# Patient Record
Sex: Female | Born: 1990 | Race: Black or African American | Hispanic: No | Marital: Single | Smoking: Never smoker
Health system: Southern US, Community
[De-identification: ages and names within clinical notes are randomized; demographics above are authoritative.]

---

## 2011-03-22 ENCOUNTER — Emergency Department (HOSPITAL_COMMUNITY): Payer: Medicaid Other

## 2011-03-22 ENCOUNTER — Emergency Department (HOSPITAL_COMMUNITY)
Admission: EM | Admit: 2011-03-22 | Discharge: 2011-03-22 | Disposition: A | Discharge status: Home / Self Care | Payer: Medicaid Other | Attending: Emergency Medicine | Admitting: Emergency Medicine

## 2011-03-22 DIAGNOSIS — M546 Pain in thoracic spine: Secondary | ICD-10-CM | POA: Insufficient documentation

## 2011-03-22 DIAGNOSIS — M542 Cervicalgia: Secondary | ICD-10-CM | POA: Insufficient documentation

## 2011-03-22 DIAGNOSIS — S139XXA Sprain of joints and ligaments of unspecified parts of neck, initial encounter: Secondary | ICD-10-CM | POA: Insufficient documentation

## 2011-03-22 DIAGNOSIS — S9030XA Contusion of unspecified foot, initial encounter: Secondary | ICD-10-CM | POA: Insufficient documentation

## 2011-03-22 DIAGNOSIS — T148XXA Other injury of unspecified body region, initial encounter: Secondary | ICD-10-CM | POA: Insufficient documentation

## 2011-03-22 LAB — URINALYSIS, ROUTINE W REFLEX MICROSCOPIC
Glucose, UA: NEGATIVE mg/dL
Ketones, ur: NEGATIVE mg/dL
Protein, ur: NEGATIVE mg/dL
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
## 2012-10-26 ENCOUNTER — Emergency Department (HOSPITAL_BASED_OUTPATIENT_CLINIC_OR_DEPARTMENT_OTHER): Payer: Medicaid Other

## 2012-10-26 ENCOUNTER — Emergency Department (HOSPITAL_BASED_OUTPATIENT_CLINIC_OR_DEPARTMENT_OTHER)
Admission: EM | Admit: 2012-10-26 | Discharge: 2012-10-26 | Disposition: A | Discharge status: Home / Self Care | Payer: Medicaid Other | Attending: Emergency Medicine | Admitting: Emergency Medicine

## 2012-10-26 ENCOUNTER — Encounter (HOSPITAL_BASED_OUTPATIENT_CLINIC_OR_DEPARTMENT_OTHER): Payer: Self-pay | Admitting: *Deleted

## 2012-10-26 DIAGNOSIS — Z3490 Encounter for supervision of normal pregnancy, unspecified, unspecified trimester: Secondary | ICD-10-CM

## 2012-10-26 DIAGNOSIS — N760 Acute vaginitis: Secondary | ICD-10-CM | POA: Insufficient documentation

## 2012-10-26 DIAGNOSIS — O9989 Other specified diseases and conditions complicating pregnancy, childbirth and the puerperium: Secondary | ICD-10-CM | POA: Insufficient documentation

## 2012-10-26 DIAGNOSIS — B9689 Other specified bacterial agents as the cause of diseases classified elsewhere: Secondary | ICD-10-CM

## 2012-10-26 DIAGNOSIS — R197 Diarrhea, unspecified: Secondary | ICD-10-CM | POA: Insufficient documentation

## 2012-10-26 DIAGNOSIS — Z3201 Encounter for pregnancy test, result positive: Secondary | ICD-10-CM | POA: Insufficient documentation

## 2012-10-26 DIAGNOSIS — O219 Vomiting of pregnancy, unspecified: Secondary | ICD-10-CM | POA: Insufficient documentation

## 2012-10-26 DIAGNOSIS — R109 Unspecified abdominal pain: Secondary | ICD-10-CM | POA: Insufficient documentation

## 2012-10-26 LAB — WET PREP, GENITAL: Trich, Wet Prep: NONE SEEN

## 2012-10-26 LAB — URINALYSIS, ROUTINE W REFLEX MICROSCOPIC
Bilirubin Urine: NEGATIVE
Hgb urine dipstick: NEGATIVE
Specific Gravity, Urine: 1.026 (ref 1.005–1.030)
pH: 5 (ref 5.0–8.0)

## 2012-10-26 MED ORDER — PRENATAL COMPLETE 14-0.4 MG PO TABS: ORAL_TABLET | ORAL | Status: DC

## 2012-10-26 MED ORDER — ONDANSETRON HCL 4 MG PO TABS: 4.0000 mg | ORAL_TABLET | Freq: Four times a day (QID) | ORAL | Status: DC

## 2012-10-26 MED ORDER — METRONIDAZOLE 500 MG PO TABS: 500.0000 mg | ORAL_TABLET | Freq: Two times a day (BID) | ORAL | Status: DC

## 2012-10-26 NOTE — ED Provider Notes (Signed)
History     CSN: [id number]  Arrival date & time 10/26/12  1237   First MD Initiated Contact with Patient 10/26/12 1248      Chief Complaint  Patient presents with  . Abdominal Pain    (Consider location/radiation/quality/duration/timing/severity/associated sxs/prior treatment) Patient is a 22 y.o. female presenting with vomiting. The history is provided by the patient. No language interpreter was used.  Emesis  This is a new problem. The current episode started 12 to 24 hours ago. The problem occurs 2 to 4 times per day. The problem has been gradually worsening. The emesis has an appearance of stomach contents. There has been no fever. Associated symptoms include abdominal pain and diarrhea. Pertinent negatives include no chills and no fever.  Pt complains of vomiting and diarrhea.   Pt reports she is early pregnant.  Last period in November.  No prenatal care  History reviewed. No pertinent past medical history.  History reviewed. No pertinent past surgical history.  History reviewed. No pertinent family history.  History  Substance Use Topics  . Smoking status: Never Smoker   . Smokeless tobacco: Not on file  . Alcohol Use: No    OB History    Grav Para Term Preterm Abortions TAB SAB Ect Mult Living   3 1 1  1            Review of Systems  Constitutional: Negative for fever and chills.  Gastrointestinal: Positive for vomiting, abdominal pain and diarrhea.  All other systems reviewed and are negative.    Allergies  Review of patient's allergies indicates no known allergies.  Home Medications  No current outpatient prescriptions on file.  BP 120/56  Pulse 95  Temp 97.9 F (36.6 C) (Oral)  Resp 20  Wt 170 lb (77.111 kg)  SpO2 100%  LMP 09/09/2012  Physical Exam  Nursing note and vitals reviewed. Constitutional: She is oriented to person, place, and time. She appears well-developed and well-nourished.  HENT:  Head: Normocephalic and atraumatic.  Eyes:  Conjunctivae normal and EOM are normal. Pupils are equal, round, and reactive to light.  Neck: Normal range of motion. Neck supple.  Cardiovascular: Normal rate.   Pulmonary/Chest: Effort normal and breath sounds normal.  Abdominal: Soft.  Musculoskeletal: Normal range of motion.  Neurological: She is alert and oriented to person, place, and time. She has normal reflexes.  Skin: Skin is warm.  Psychiatric: She has a normal mood and affect.    ED Course  Procedures (including critical care time)  Labs Reviewed  PREGNANCY, URINE - Abnormal; Notable for the following:    Preg Test, Ur POSITIVE (*)     All other components within normal limits  URINALYSIS, ROUTINE W REFLEX MICROSCOPIC - Abnormal; Notable for the following:    APPearance CLOUDY (*)     Ketones, ur 40 (*)     All other components within normal limits   No results found.   No diagnosis found.    MDM  Ultrasound 8 week 4 day iup.  moderate clue cells Pt given rx for flagyl, prenatal vitamins and zofran   Pt advised to schedule prenatal care        Elson Areas, [place] 10/26/12 1454

## 2012-10-26 NOTE — ED Notes (Signed)
Pelvic cart is set up and at the bedside. The cart is ready for the doctor to use.

## 2012-10-26 NOTE — ED Notes (Addendum)
Patient having sharp abd pain throughout abd. States that she has not seen an ob/gyn. Denies any bleeding or discharge

## 2012-10-27 NOTE — ED Provider Notes (Signed)
Medical screening examination/treatment/procedure(s) were performed by non-physician practitioner and as supervising physician I was immediately available for consultation/collaboration.   Charlestine Rookstool B. Bernette Mayers, MD 10/27/12 [phone]

## 2012-10-28 LAB — GC/CHLAMYDIA PROBE AMP
CT Probe RNA: NEGATIVE
GC Probe RNA: NEGATIVE

## 2014-08-11 ENCOUNTER — Emergency Department (HOSPITAL_BASED_OUTPATIENT_CLINIC_OR_DEPARTMENT_OTHER): Payer: No Typology Code available for payment source

## 2014-08-11 ENCOUNTER — Emergency Department (HOSPITAL_BASED_OUTPATIENT_CLINIC_OR_DEPARTMENT_OTHER)
Admission: EM | Admit: 2014-08-11 | Discharge: 2014-08-11 | Disposition: A | Discharge status: Home / Self Care | Payer: No Typology Code available for payment source | Attending: Emergency Medicine | Admitting: Emergency Medicine

## 2014-08-11 ENCOUNTER — Encounter (HOSPITAL_BASED_OUTPATIENT_CLINIC_OR_DEPARTMENT_OTHER): Payer: Self-pay | Admitting: Emergency Medicine

## 2014-08-11 DIAGNOSIS — N760 Acute vaginitis: Secondary | ICD-10-CM | POA: Diagnosis not present

## 2014-08-11 DIAGNOSIS — Z792 Long term (current) use of antibiotics: Secondary | ICD-10-CM | POA: Diagnosis not present

## 2014-08-11 DIAGNOSIS — R109 Unspecified abdominal pain: Secondary | ICD-10-CM

## 2014-08-11 DIAGNOSIS — Z79899 Other long term (current) drug therapy: Secondary | ICD-10-CM | POA: Diagnosis not present

## 2014-08-11 DIAGNOSIS — K529 Noninfective gastroenteritis and colitis, unspecified: Secondary | ICD-10-CM

## 2014-08-11 DIAGNOSIS — R1030 Lower abdominal pain, unspecified: Secondary | ICD-10-CM | POA: Insufficient documentation

## 2014-08-11 DIAGNOSIS — R102 Pelvic and perineal pain: Secondary | ICD-10-CM

## 2014-08-11 DIAGNOSIS — Z3202 Encounter for pregnancy test, result negative: Secondary | ICD-10-CM | POA: Insufficient documentation

## 2014-08-11 DIAGNOSIS — B9689 Other specified bacterial agents as the cause of diseases classified elsewhere: Secondary | ICD-10-CM

## 2014-08-11 LAB — COMPREHENSIVE METABOLIC PANEL
ALT: 12 U/L (ref 0–35)
AST: 15 U/L (ref 0–37)
Albumin: 3.7 g/dL (ref 3.5–5.2)
Alkaline Phosphatase: 60 U/L (ref 39–117)
Anion gap: 14 (ref 5–15)
BILIRUBIN TOTAL: 0.5 mg/dL (ref 0.3–1.2)
BUN: 10 mg/dL (ref 6–23)
CALCIUM: 9.4 mg/dL (ref 8.4–10.5)
CHLORIDE: 99 meq/L (ref 96–112)
CO2: 24 meq/L (ref 19–32)
CREATININE: 0.8 mg/dL (ref 0.50–1.10)
GLUCOSE: 119 mg/dL — AB (ref 70–99)
Potassium: 3.4 mEq/L — ABNORMAL LOW (ref 3.7–5.3)
Sodium: 137 mEq/L (ref 137–147)
Total Protein: 8.3 g/dL (ref 6.0–8.3)

## 2014-08-11 LAB — URINALYSIS, ROUTINE W REFLEX MICROSCOPIC
BILIRUBIN URINE: NEGATIVE
Glucose, UA: NEGATIVE mg/dL
Hgb urine dipstick: NEGATIVE
KETONES UR: NEGATIVE mg/dL
Leukocytes, UA: NEGATIVE
NITRITE: NEGATIVE
PH: 5.5 (ref 5.0–8.0)
PROTEIN: NEGATIVE mg/dL
Specific Gravity, Urine: 1.021 (ref 1.005–1.030)
Urobilinogen, UA: 1 mg/dL (ref 0.0–1.0)

## 2014-08-11 LAB — CBC
HEMATOCRIT: 29.5 % — AB (ref 36.0–46.0)
HEMOGLOBIN: 9.3 g/dL — AB (ref 12.0–15.0)
MCH: 24.9 pg — AB (ref 26.0–34.0)
MCHC: 31.5 g/dL (ref 30.0–36.0)
MCV: 79.1 fL (ref 78.0–100.0)
PLATELETS: 351 10*3/uL (ref 150–400)
RBC: 3.73 MIL/uL — AB (ref 3.87–5.11)
RDW: 15.1 % (ref 11.5–15.5)
WBC: 25.8 10*3/uL — AB (ref 4.0–10.5)

## 2014-08-11 LAB — WET PREP, GENITAL
TRICH WET PREP: NONE SEEN
Yeast Wet Prep HPF POC: NONE SEEN

## 2014-08-11 LAB — PREGNANCY, URINE: Preg Test, Ur: NEGATIVE

## 2014-08-11 LAB — LIPASE, BLOOD: LIPASE: 22 U/L (ref 11–59)

## 2014-08-11 MED ORDER — IOHEXOL 300 MG/ML  SOLN
100.0000 mL | Freq: Once | INTRAMUSCULAR | Status: AC | PRN
  Administered 2014-08-11: 100 mL via INTRAVENOUS

## 2014-08-11 MED ORDER — IOHEXOL 300 MG/ML  SOLN
25.0000 mL | Freq: Once | INTRAMUSCULAR | Status: AC | PRN
  Administered 2014-08-11: 25 mL via ORAL

## 2014-08-11 MED ORDER — HYDROCODONE-ACETAMINOPHEN 5-325 MG PO TABS: 1.0000 | ORAL_TABLET | ORAL | Status: DC | PRN

## 2014-08-11 MED ORDER — METRONIDAZOLE 500 MG PO TABS: 500.0000 mg | ORAL_TABLET | Freq: Two times a day (BID) | ORAL | Status: DC

## 2014-08-11 MED ORDER — DEXTROSE 5 % IV SOLN
250.0000 mg | Freq: Once | INTRAVENOUS | Status: DC
  Filled 2014-08-11: qty 250

## 2014-08-11 MED ORDER — CIPROFLOXACIN HCL 500 MG PO TABS: 500.0000 mg | ORAL_TABLET | Freq: Two times a day (BID) | ORAL | Status: DC

## 2014-08-11 MED ORDER — AZITHROMYCIN 250 MG PO TABS
1000.0000 mg | ORAL_TABLET | Freq: Once | ORAL | Status: AC
  Administered 2014-08-11: 1000 mg via ORAL
  Filled 2014-08-11: qty 4

## 2014-08-11 MED ORDER — CEFTRIAXONE SODIUM 250 MG IJ SOLR
250.0000 mg | INTRAMUSCULAR | Status: DC
  Administered 2014-08-11: 250 mg via INTRAMUSCULAR

## 2014-08-11 MED ORDER — CEFTRIAXONE SODIUM 250 MG IJ SOLR: 250.0000 mg | INTRAMUSCULAR | Status: DC

## 2014-08-11 MED ORDER — KETOROLAC TROMETHAMINE 30 MG/ML IJ SOLN
30.0000 mg | Freq: Once | INTRAMUSCULAR | Status: AC
  Administered 2014-08-11: 30 mg via INTRAVENOUS
  Filled 2014-08-11: qty 1

## 2014-08-11 MED ORDER — CEFTRIAXONE SODIUM 250 MG IJ SOLR
INTRAMUSCULAR | Status: AC
  Administered 2014-08-11: 250 mg via INTRAMUSCULAR
  Filled 2014-08-11: qty 250

## 2014-08-11 NOTE — ED Provider Notes (Signed)
CSN: [id number]     Arrival date & time 08/11/14  1520 History   First MD Initiated Contact with Patient 08/11/14 1541     Chief Complaint  Patient presents with  . Abdominal Pain     (Consider location/radiation/quality/duration/timing/severity/associated sxs/prior Treatment) HPI Comments: This is a 23 year old female who presents to the emergency department complaining of generalized abdominal pain 2 days. Patient reports her abdomen hurts all over, worse across the lower portion of her abdomen, described as a sharp cramp rated 10/10, unrelieved by Advil. States she is at the end of her menses, however normally her menses are heavy for 7 days, this cycle she was heavy for 3 days and very light for the remaining few days. Admits to chills without fever. Denies nausea or vomiting. States she's had a few episodes of nonbloody diarrhea over the past few days. She cannot recall eating anything out of the normal, her boyfriend had the same dinner prior to the onset of pain and he is feeling fine. States her lower abdomen hurts more when she urinates. Denies increased urinary frequency, urgency or dysuria. Denies vaginal discharge. States she has not been sexually active for the past 3-4 months, however when she was sexually active she did not use protection and is not on any birth control. No history of abdominal surgeries.  Patient is a 23 y.o. female presenting with abdominal pain. The history is provided by the patient.  Abdominal Pain   History reviewed. No pertinent past medical history. History reviewed. No pertinent past surgical history. No family history on file. History  Substance Use Topics  . Smoking status: Never Smoker   . Smokeless tobacco: Not on file  . Alcohol Use: No   OB History   Grav Para Term Preterm Abortions TAB SAB Ect Mult Living   3 1 1  1           Review of Systems  Gastrointestinal: Positive for abdominal pain.   10 Systems reviewed and are negative for  acute change except as noted in the HPI.   Allergies  Review of patient's allergies indicates no known allergies.  Home Medications   Prior to Admission medications   Medication Sig Start Date End Date Taking? Authorizing Provider  ciprofloxacin (CIPRO) 500 MG tablet Take 1 tablet (500 mg total) by mouth 2 (two) times daily. One po bid x 7 days 08/11/14   Kathrynn Speed, PA-C  HYDROcodone-acetaminophen (NORCO/VICODIN) 5-325 MG per tablet Take 1-2 tablets by mouth every 4 (four) hours as needed. 08/11/14   Cheney Ewart M Karlen Barbar, PA-C  metroNIDAZOLE (FLAGYL) 500 MG tablet Take 1 tablet (500 mg total) by mouth 2 (two) times daily. 10/26/12   Elson Areas, PA-C  metroNIDAZOLE (FLAGYL) 500 MG tablet Take 1 tablet (500 mg total) by mouth 2 (two) times daily. One po bid x 7 days 08/11/14   Kathrynn Speed, PA-C  ondansetron (ZOFRAN) 4 MG tablet Take 1 tablet (4 mg total) by mouth every 6 (six) hours. 10/26/12   Elson Areas, PA-C  Prenatal Vit-Fe Fumarate-FA (PRENATAL COMPLETE) 14-0.4 MG TABS One a day, 10/26/12   Elson Areas, PA-C   BP 111/67  Pulse 114  Temp(Src) 99.1 F (37.3 C) (Oral)  Resp 20  Ht 5\' 4"  (1.626 m)  Wt 170 lb (77.111 kg)  BMI 29.17 kg/m2  SpO2 100%  LMP 08/07/2014  Breastfeeding? Unknown Physical Exam  Nursing note and vitals reviewed. Constitutional: She is oriented to person, place, and time.  She appears well-developed and well-nourished. No distress.  HENT:  Head: Normocephalic and atraumatic.  Mouth/Throat: Oropharynx is clear and moist.  Eyes: Conjunctivae are normal.  Neck: Normal range of motion. Neck supple.  Cardiovascular: Regular rhythm and normal heart sounds.   Tachycardic.  Pulmonary/Chest: Effort normal and breath sounds normal.  Abdominal: Soft.  Generalized abdominal tenderness, worse across her lower abdomen with guarding. No rigidity or rebound. No peritoneal signs.  Genitourinary: Uterus is tender. Cervix exhibits discharge (yellow). Cervix exhibits no  motion tenderness and no friability. No erythema, tenderness or bleeding around the vagina. Vaginal discharge (copious, yellow, malodorous) found.  Musculoskeletal: Normal range of motion. She exhibits no edema.  Neurological: She is alert and oriented to person, place, and time.  Skin: Skin is warm and dry. She is not diaphoretic.  Psychiatric: She has a normal mood and affect. Her behavior is normal.    ED Course  Procedures (including critical care time) Labs Review Labs Reviewed  WET PREP, GENITAL - Abnormal; Notable for the following:    Clue Cells Wet Prep HPF POC MODERATE (*)    WBC, Wet Prep HPF POC MODERATE (*)    All other components within normal limits  CBC - Abnormal; Notable for the following:    WBC 25.8 (*)    RBC 3.73 (*)    Hemoglobin 9.3 (*)    HCT 29.5 (*)    MCH 24.9 (*)    All other components within normal limits  COMPREHENSIVE METABOLIC PANEL - Abnormal; Notable for the following:    Potassium 3.4 (*)    Glucose, Bld 119 (*)    All other components within normal limits  URINALYSIS, ROUTINE W REFLEX MICROSCOPIC - Abnormal; Notable for the following:    APPearance CLOUDY (*)    All other components within normal limits  GC/CHLAMYDIA PROBE AMP  LIPASE, BLOOD  PREGNANCY, URINE    Imaging Review [place] Transvaginal Non-ob  08/11/2014   CLINICAL DATA:  Abdominal pain for 12 hr.  EXAM: TRANSABDOMINAL ULTRASOUND OF PELVIS  TECHNIQUE: Transabdominal ultrasound examination of the pelvis was performed including evaluation of the uterus, ovaries, adnexal regions, and pelvic cul-de-sac.  COMPARISON:  08/11/2014 abdominal CT  FINDINGS: Uterus  Measurements: 8 x 5 x 6 cm. No fibroids or other mass visualized.  Endometrium  Thickness: 2 mm. Minimal fluid within the endometrial cavity. No focal abnormality  Right ovary  Measurements: 3.6 x 2.2 x 2.2 cm. Normal appearance/no adnexal mass.  Left ovary  Measurements: 3.1 x 1.4 x 2.3 cm. Normal appearance/no adnexal mass.  Other  findings: No free pelvic fluid. Multiple fluid-filled tubular structures in the pelvis correlate with fluid-filled bowel on preceding abdominal CT. No convincing hydrosalpinx.  IMPRESSION: Negative pelvic ultrasound.   Electronically Signed   By: Tiburcio PeaJonathan  Watts M.D.   On: 08/11/2014 19:08   [place] Pelvis Complete  08/11/2014   CLINICAL DATA:  Abdominal pain for 12 hr.  EXAM: TRANSABDOMINAL ULTRASOUND OF PELVIS  TECHNIQUE: Transabdominal ultrasound examination of the pelvis was performed including evaluation of the uterus, ovaries, adnexal regions, and pelvic cul-de-sac.  COMPARISON:  08/11/2014 abdominal CT  FINDINGS: Uterus  Measurements: 8 x 5 x 6 cm. No fibroids or other mass visualized.  Endometrium  Thickness: 2 mm. Minimal fluid within the endometrial cavity. No focal abnormality  Right ovary  Measurements: 3.6 x 2.2 x 2.2 cm. Normal appearance/no adnexal mass.  Left ovary  Measurements: 3.1 x 1.4 x 2.3 cm. Normal appearance/no adnexal mass.  Other findings:  No free pelvic fluid. Multiple fluid-filled tubular structures in the pelvis correlate with fluid-filled bowel on preceding abdominal CT. No convincing hydrosalpinx.  IMPRESSION: Negative pelvic ultrasound.   Electronically Signed   By: Jonathan  Watts M.D.   On: 10/27/20Tiburcio Pea15 19:08   Ct Abdomen Pelvis W Contrast  08/11/2014   CLINICAL DATA:  Lower abdominal pain for 1 day  EXAM: CT ABDOMEN AND PELVIS WITH CONTRAST  TECHNIQUE: Multidetector CT imaging of the abdomen and pelvis was performed using the standard protocol following bolus administration of intravenous contrast.  CONTRAST:  100mL OMNIPAQUE IOHEXOL 300 MG/ML  SOLN  COMPARISON:  None.  FINDINGS: Lung bases are free of acute infiltrate or sizable effusion.  The liver is mildly fatty infiltrated. The gallbladder is decompressed. The spleen, adrenal glands and pancreas are normal in their CT appearance. The kidneys are well visualized bilaterally. Normal enhancement is seen.  The appendix is well  visualized and within normal limits. Fluid is noted throughout the colon of uncertain significance. No free pelvic fluid is noted. No acute bony abnormality is noted.  IMPRESSION: Fluid-filled colon which may be related to enteritis. Clinical correlation is recommended.  No other focal abnormality is noted.   Electronically Signed   By: Alcide CleverMark  Lukens M.D.   On: 08/11/2014 18:08     EKG Interpretation None      MDM   Final diagnoses:  Abdominal pain  Enteritis  BV (bacterial vaginosis)   Patient with abdominal pain, abnormal menses. She is nontoxic appearing. NAD. Tachycardic, vitals otherwise stable. Plan to obtain labs and do pelvic exam. 7:23 PM Leukocytosis of 25.8. UA negative for infection. Wet prep positive for clue cells and WBC. No CMT. She had uterine and adnexal tenderness on exam. Concern for pelvic pathology vs abdominal pathology given diarrhea. Both CT abd/pelvis and pelvic US obtained to evaluate for possible TOA. Results consistent with enteritis. Pelvic US normal. Will treat with Cipro and Flagyl, Flagyl also cover for BV. She is also requesting STD treatment. Rocephin and azithromycin given in the emergency department. GC/Chlamydia cultures pending. Pain improved with toradol. Repeat abdominal exam with significant improvement. Stable for discharge. She will follow-up with the health department. Return precautions given. Patient states understanding of treatment care plan and is agreeable.  Kathrynn SpeedRobyn M Brett Soza, PA-C 08/11/14 [phone]

## 2014-08-11 NOTE — ED Notes (Signed)
Lower abdominal pain since yesterday. Menses ending.

## 2014-08-11 NOTE — Discharge Instructions (Signed)
Take Vicodin for severe pain only. No driving or operating heavy machinery while taking vicodin. This medication may cause drowsiness. Take antibiotic to completion. You were treated today for both gonorrhea and Chlamydia. If these tests results positive, you will be contacted and are then obligated to inform your partner.  Abdominal Pain Many things can cause abdominal pain. Usually, abdominal pain is not caused by a disease and will improve without treatment. It can often be observed and treated at home. Your health care provider will do a physical exam and possibly order blood tests and X-rays to help determine the seriousness of your pain. However, in many cases, more time must pass before a clear cause of the pain can be found. Before that point, your health care provider may not know if you need more testing or further treatment. HOME CARE INSTRUCTIONS  Monitor your abdominal pain for any changes. The following actions may help to alleviate any discomfort you are experiencing:  Only take over-the-counter or prescription medicines as directed by your health care provider.  Do not take laxatives unless directed to do so by your health care provider.  Try a clear liquid diet (broth, tea, or water) as directed by your health care provider. Slowly move to a bland diet as tolerated. SEEK MEDICAL CARE IF:  You have unexplained abdominal pain.  You have abdominal pain associated with nausea or diarrhea.  You have pain when you urinate or have a bowel movement.  You experience abdominal pain that wakes you in the night.  You have abdominal pain that is worsened or improved by eating food.  You have abdominal pain that is worsened with eating fatty foods.  You have a fever. SEEK IMMEDIATE MEDICAL CARE IF:   Your pain does not go away within 2 hours.  You keep throwing up (vomiting).  Your pain is felt only in portions of the abdomen, such as the right side or the left lower portion of  the abdomen.  You pass bloody or black tarry stools. MAKE SURE YOU:  Understand these instructions.   Will watch your condition.   Will get help right away if you are not doing well or get worse.  Document Released: 07/12/2005 Document Revised: 10/07/2013 Document Reviewed: 06/11/2013 [facility] Patient Information 2015 [place], [place]. This information is not intended to replace advice given to you by your health care provider. Make sure you discuss any questions you have with your health care provider.  Colitis Colitis is inflammation of the colon. Colitis can be a short-term or long-standing (chronic) illness. Crohn's disease and ulcerative colitis are 2 types of colitis which are chronic. They usually require lifelong treatment. CAUSES  There are many different causes of colitis, including:  Viruses.  Germs (bacteria).  Medicine reactions. SYMPTOMS   Diarrhea.  Intestinal bleeding.  Pain.  Fever.  Throwing up (vomiting).  Tiredness (fatigue).  Weight loss.  Bowel blockage. DIAGNOSIS  The diagnosis of colitis is based on examination and stool or blood tests. X-rays, CT scan, and colonoscopy may also be needed. TREATMENT  Treatment may include:  Fluids given through the vein (intravenously).  Bowel rest (nothing to eat or drink for a period of time).  Medicine for pain and diarrhea.  Medicines (antibiotics) that kill germs.  Cortisone medicines.  Surgery. HOME CARE INSTRUCTIONS   Get plenty of rest.  Drink enough water and fluids to keep your urine clear or pale yellow.  Eat a well-balanced diet.  Call your caregiver for follow-up as recommended.  SEEK IMMEDIATE MEDICAL CARE IF:   You develop chills.  You have an oral temperature above 102 F (38.9 C), not controlled by medicine.  You have extreme weakness, fainting, or dehydration.  You have repeated vomiting.  You develop severe belly (abdominal) pain or are passing bloody or tarry  stools. MAKE SURE YOU:   Understand these instructions.  Will watch your condition.  Will get help right away if you are not doing well or get worse. Document Released: 11/09/2004 Document Revised: 12/25/2011 Document Reviewed: 02/04/2010 [facility] Patient Information 2015 [place], [place]. This information is not intended to replace advice given to you by your health care provider. Make sure you discuss any questions you have with your health care provider.  Sexually Transmitted Disease A sexually transmitted disease (STD) is a disease or infection that may be passed (transmitted) from person to person, usually during sexual activity. This may happen by way of saliva, semen, blood, vaginal mucus, or urine. Common STDs include:   Gonorrhea.   Chlamydia.   Syphilis.   HIV and AIDS.   Genital herpes.   Hepatitis B and C.   Trichomonas.   Human papillomavirus (HPV).   Pubic lice.   Scabies.  Mites.  Bacterial vaginosis. WHAT ARE CAUSES OF STDs? An STD may be caused by bacteria, a virus, or parasites. STDs are often transmitted during sexual activity if one person is infected. However, they may also be transmitted through nonsexual means. STDs may be transmitted after:   Sexual intercourse with an infected person.   Sharing sex toys with an infected person.   Sharing needles with an infected person or using unclean piercing or tattoo needles.  Having intimate contact with the genitals, mouth, or rectal areas of an infected person.   Exposure to infected fluids during birth. WHAT ARE THE SIGNS AND SYMPTOMS OF STDs? Different STDs have different symptoms. Some people may not have any symptoms. If symptoms are present, they may include:   Painful or bloody urination.   Pain in the pelvis, abdomen, vagina, anus, throat, or eyes.   A skin rash, itching, or irritation.  Growths, ulcerations, blisters, or sores in the genital and anal areas.  Abnormal  vaginal discharge with or without bad odor.   Penile discharge in men.   Fever.   Pain or bleeding during sexual intercourse.   Swollen glands in the groin area.   Yellow skin and eyes (jaundice). This is seen with hepatitis.   Swollen testicles.  Infertility.  Sores and blisters in the mouth. HOW ARE STDs DIAGNOSED? To make a diagnosis, your health care provider may:   Take a medical history.   Perform a physical exam.   Take a sample of any discharge to examine.  Swab the throat, cervix, opening to the penis, rectum, or vagina for testing.  Test a sample of your first morning urine.   Perform blood tests.   Perform a Pap test, if this applies.   Perform a colposcopy.   Perform a laparoscopy.  HOW ARE STDs TREATED? Treatment depends on the STD. Some STDs may be treated but not cured.   Chlamydia, gonorrhea, trichomonas, and syphilis can be cured with antibiotic medicine.   Genital herpes, hepatitis, and HIV can be treated, but not cured, with prescribed medicines. The medicines lessen symptoms.   Genital warts from HPV can be treated with medicine or by freezing, burning (electrocautery), or surgery. Warts may come back.   HPV cannot be cured with medicine or surgery. However,  abnormal areas may be removed from the cervix, vagina, or vulva.   If your diagnosis is confirmed, your recent sexual partners need treatment. This is true even if they are symptom-free or have a negative culture or evaluation. They should not have sex until their health care providers say it is okay. HOW CAN I REDUCE MY RISK OF GETTING AN STD? Take these steps to reduce your risk of getting an STD:  Use latex condoms, dental dams, and water-soluble lubricants during sexual activity. Do not use petroleum jelly or oils.  Avoid having multiple sex partners.  Do not have sex with someone who has other sex partners.  Do not have sex with anyone you do not know or who is  at high risk for an STD.  Avoid risky sex practices that can break your skin.  Do not have sex if you have open sores on your mouth or skin.  Avoid drinking too much alcohol or taking illegal drugs. Alcohol and drugs can affect your judgment and put you in a vulnerable position.  Avoid engaging in oral and anal sex acts.  Get vaccinated for HPV and hepatitis. If you have not received these vaccines in the past, talk to your health care provider about whether one or both might be right for you.   If you are at risk of being infected with HIV, it is recommended that you take a prescription medicine daily to prevent HIV infection. This is called pre-exposure prophylaxis (PrEP). You are considered at risk if:  You are a man who has sex with other men (MSM).  You are a heterosexual man or woman and are sexually active with more than one partner.  You take drugs by injection.  You are sexually active with a partner who has HIV.  Talk with your health care provider about whether you are at high risk of being infected with HIV. If you choose to begin PrEP, you should first be tested for HIV. You should then be tested every 3 months for as long as you are taking PrEP.  WHAT SHOULD I DO IF I THINK I HAVE AN STD?  See your health care provider.   Tell your sexual partner(s). They should be tested and treated for any STDs.  Do not have sex until your health care provider says it is okay. WHEN SHOULD I GET IMMEDIATE MEDICAL CARE? Contact your health care provider right away if:   You have severe abdominal pain.  You are a man and notice swelling or pain in your testicles.  You are a woman and notice swelling or pain in your vagina. Document Released: 12/23/2002 Document Revised: 10/07/2013 Document Reviewed: 04/22/2013 [facility] Patient Information 2015 [place], [place]. This information is not intended to replace advice given to you by your health care provider. Make sure you discuss any  questions you have with your health care provider.  Bacterial Vaginosis Bacterial vaginosis is a vaginal infection that occurs when the normal balance of bacteria in the vagina is disrupted. It results from an overgrowth of certain bacteria. This is the most common vaginal infection in women of childbearing age. Treatment is important to prevent complications, especially in pregnant women, as it can cause a premature delivery. CAUSES  Bacterial vaginosis is caused by an increase in harmful bacteria that are normally present in smaller amounts in the vagina. Several different kinds of bacteria can cause bacterial vaginosis. However, the reason that the condition develops is not fully understood. RISK FACTORS Certain activities  or behaviors can put you at an increased risk of developing bacterial vaginosis, including:  Having a new sex partner or multiple sex partners.  Douching.  Using an intrauterine device (IUD) for contraception. Women do not get bacterial vaginosis from toilet seats, bedding, swimming pools, or contact with objects around them. SIGNS AND SYMPTOMS  Some women with bacterial vaginosis have no signs or symptoms. Common symptoms include:  Grey vaginal discharge.  A fishlike odor with discharge, especially after sexual intercourse.  Itching or burning of the vagina and vulva.  Burning or pain with urination. DIAGNOSIS  Your health care provider will take a medical history and examine the vagina for signs of bacterial vaginosis. A sample of vaginal fluid may be taken. Your health care provider will look at this sample under a microscope to check for bacteria and abnormal cells. A vaginal pH test may also be done.  TREATMENT  Bacterial vaginosis may be treated with antibiotic medicines. These may be given in the form of a pill or a vaginal cream. A second round of antibiotics may be prescribed if the condition comes back after treatment.  HOME CARE INSTRUCTIONS   Only take  over-the-counter or prescription medicines as directed by your health care provider.  If antibiotic medicine was prescribed, take it as directed. Make sure you finish it even if you start to feel better.  Do not have sex until treatment is completed.  Tell all sexual partners that you have a vaginal infection. They should see their health care provider and be treated if they have problems, such as a mild rash or itching.  Practice safe sex by using condoms and only having one sex partner. SEEK MEDICAL CARE IF:   Your symptoms are not improving after 3 days of treatment.  You have increased discharge or pain.  You have a fever. MAKE SURE YOU:   Understand these instructions.  Will watch your condition.  Will get help right away if you are not doing well or get worse. FOR MORE INFORMATION  Centers for Disease Control and Prevention, Division of STD Prevention: SolutionApps.co.zawww.cdc.gov/std American Sexual Health Association (ASHA): www.ashastd.org  Document Released: 10/02/2005 Document Revised: 07/23/2013 Document Reviewed: 05/14/2013 [facility] Patient Information 2015 [place], [place]. This information is not intended to replace advice given to you by your health care provider. Make sure you discuss any questions you have with your health care provider.

## 2014-08-12 LAB — GC/CHLAMYDIA PROBE AMP
CT PROBE, AMP APTIMA: NEGATIVE
GC PROBE AMP APTIMA: POSITIVE — AB

## 2014-08-12 NOTE — ED Provider Notes (Signed)
Medical screening examination/treatment/procedure(s) were performed by non-physician practitioner and as supervising physician I was immediately available for consultation/collaboration.   EKG Interpretation None        Purvis SheffieldForrest Reeve Turnley, MD 08/12/14 1439

## 2014-08-13 ENCOUNTER — Telehealth (HOSPITAL_BASED_OUTPATIENT_CLINIC_OR_DEPARTMENT_OTHER): Payer: Self-pay | Admitting: Emergency Medicine

## 2014-08-13 NOTE — Telephone Encounter (Signed)
Post ED Visit - Positive Culture Follow-up  Culture report reviewed by antimicrobial stewardship pharmacist: []  Wes Dulaney, Pharm.D., BCPS []  Celedonio MiyamotoJeremy Frens, Pharm.D., BCPS []  Georgina PillionElizabeth Martin, [address]Ph[address] BCPS []  CozadMinh Pham, [address]Ph[address] BCPS, AAHIVP []  Estella HuskMichelle Turner, Pharm.D., BCPS, AAHIVP []  Carly Sabat, Pharm.D. []  Enzo BiNathan Batchelder, [address]  Positive gonorrhea culture Treated with rocephin and zithromax, organism sensitive to the same and no further patient follow-up is required at this time.  Berle MullMiller, Philomina Leon 08/13/2014, 11:18 AM

## 2014-08-14 ENCOUNTER — Telehealth (HOSPITAL_COMMUNITY): Payer: Self-pay

## 2014-08-15 ENCOUNTER — Telehealth (HOSPITAL_BASED_OUTPATIENT_CLINIC_OR_DEPARTMENT_OTHER): Payer: Self-pay | Admitting: Emergency Medicine

## 2014-08-16 ENCOUNTER — Telehealth (HOSPITAL_COMMUNITY): Payer: Self-pay

## 2014-08-17 ENCOUNTER — Encounter (HOSPITAL_BASED_OUTPATIENT_CLINIC_OR_DEPARTMENT_OTHER): Payer: Self-pay | Admitting: Emergency Medicine

## 2014-08-21 ENCOUNTER — Telehealth (HOSPITAL_BASED_OUTPATIENT_CLINIC_OR_DEPARTMENT_OTHER): Payer: Self-pay | Admitting: Emergency Medicine

## 2014-08-21 NOTE — Telephone Encounter (Signed)
Post ED Visit - Positive Culture Follow-up  Culture report reviewed by antimicrobial stewardship pharmacist: []  Wes Dulaney, Pharm.D., BCPS []  Celedonio MiyamotoJeremy Frens, [address] BCPS []  Georgina PillionElizabeth Martin, Pharm.D., BCPS []  BathMinh Pham, [address]Ph[address] BCPS, AAHIVP []  Estella HuskMichelle Turner, Pharm.D., BCPS, AAHIVP []  Babs BertinHaley Baird, [address]Ph[address]   Positive GC culture Treated with rocephin and zithromax sensitive to same and no further pt followup needed at this time  Berle MullMiller, Alcie Runions 08/21/2014, 4:40 PM

## 2015-10-11 IMAGING — US US TRANSVAGINAL NON-OB
1 series · 14 of 25 positions shown · non-contrast
Comparison: 08/11/2014 abdominal CT

CLINICAL DATA: Abdominal pain for 12 hr.

EXAM:
TRANSABDOMINAL ULTRASOUND OF PELVIS
TECHNIQUE: Transabdominal ultrasound examination of the pelvis was performed
including evaluation of the uterus, ovaries, adnexal regions, and
pelvic cul-de-sac.

[Series 1: us transvaginal non-ob · 0.24mm/px · 14 of 42 slices shown]
[im 1/42]
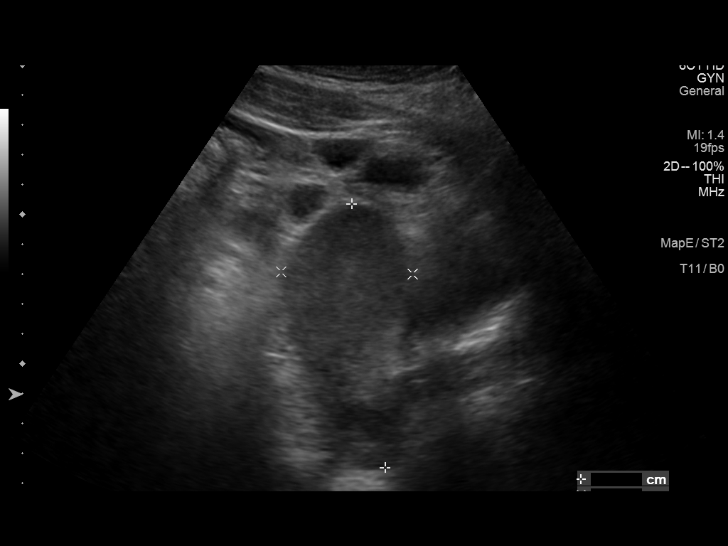
[im 4/42]
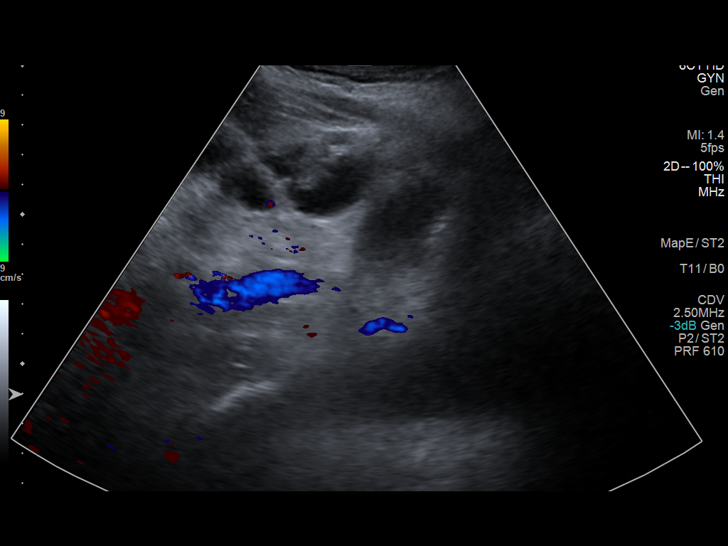
[im 7/42]
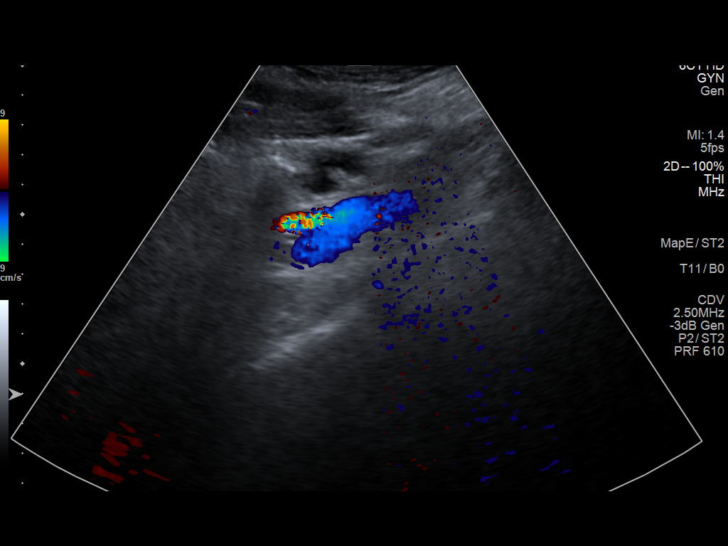
[im 11/42]
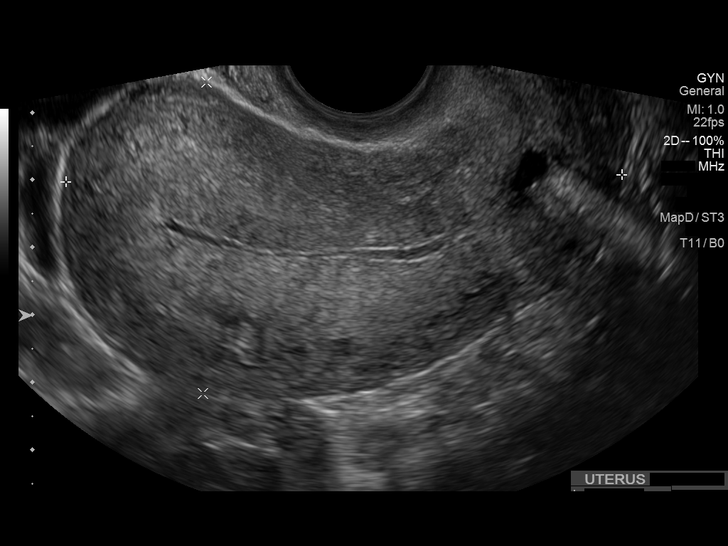
[im 14/42]
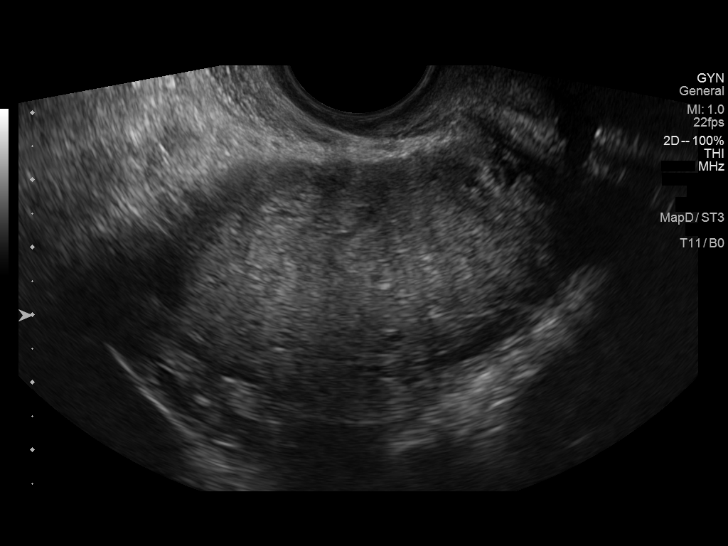
[im 16/42]
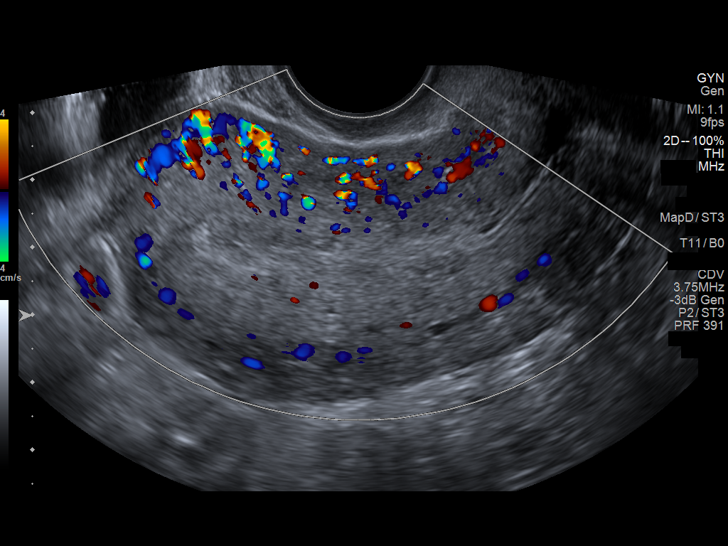
[im 19/42]
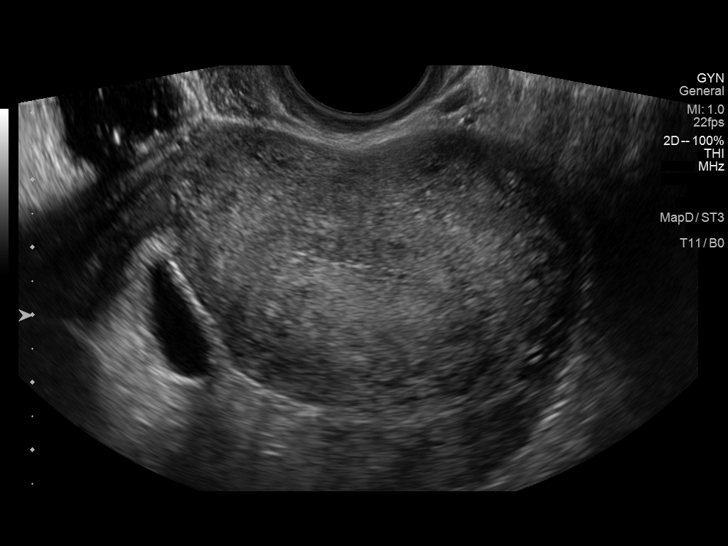
[im 23/42]
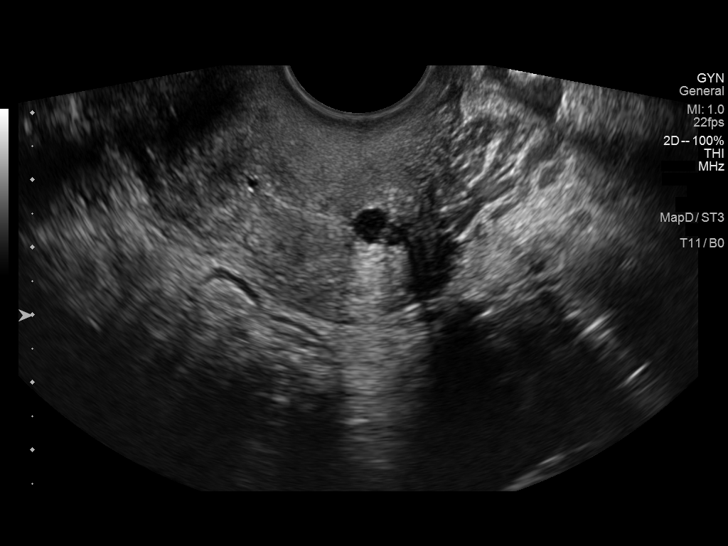
[im 26/42]
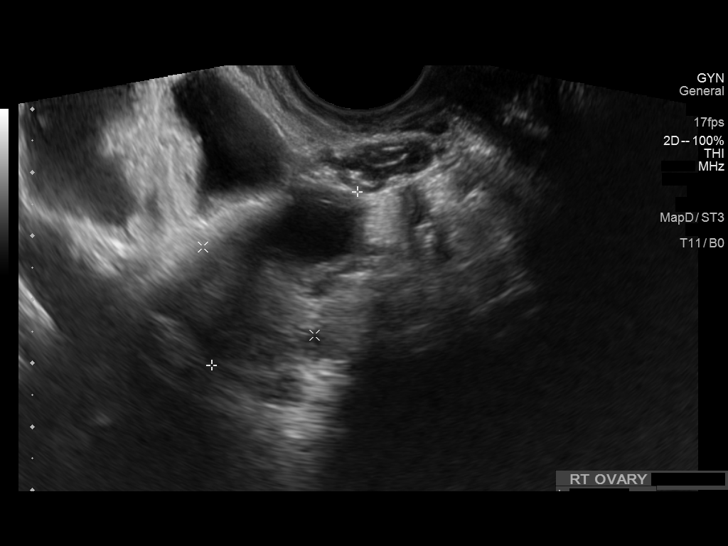
[im 28/42]
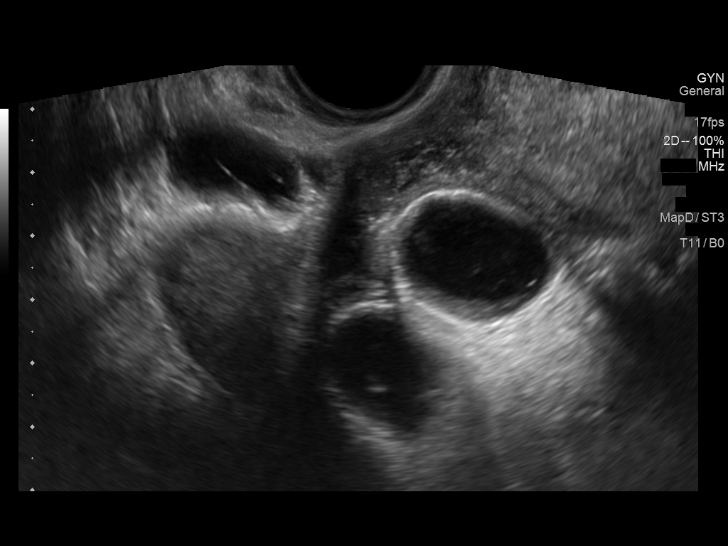
[im 31/42]
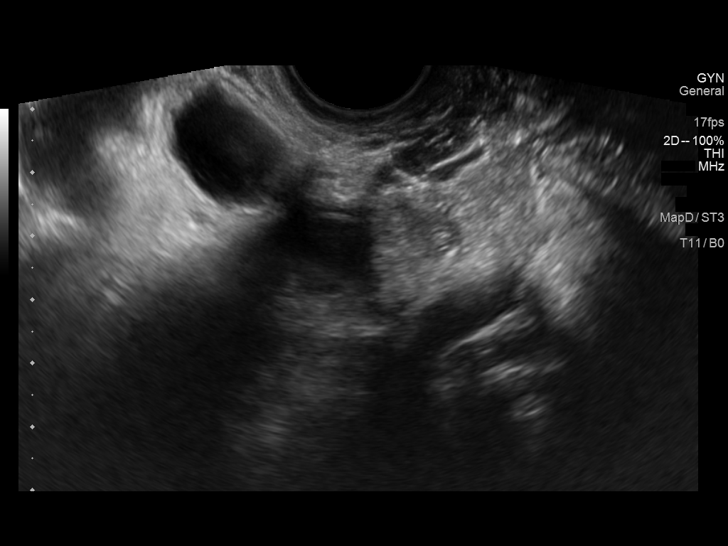
[im 35/42]
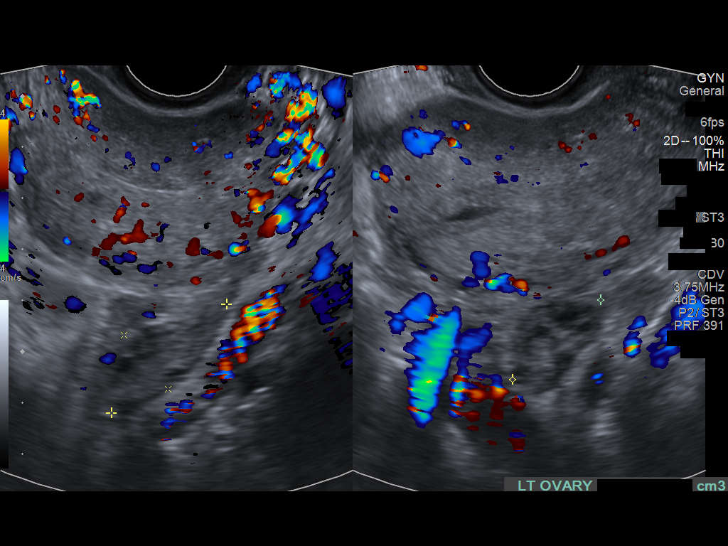
[im 38/42]
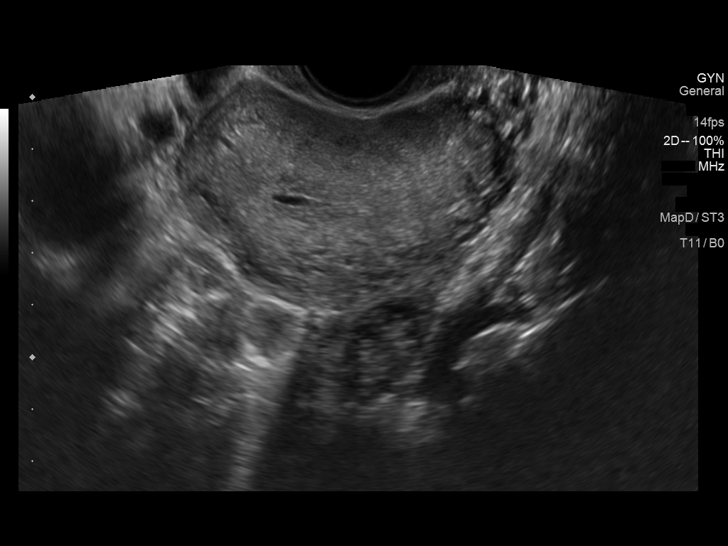
[im 42/42]
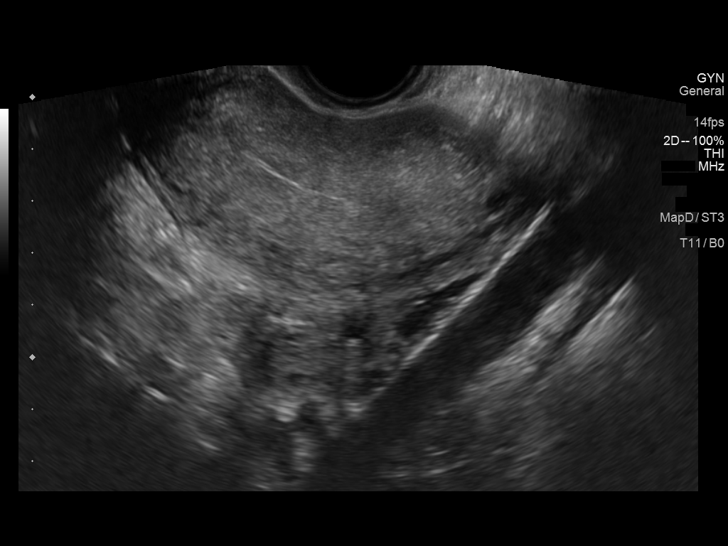

[14 of 25 positions shown; findings below may reference images not displayed]

FINDINGS: Uterus

Measurements: 8 x 5 x 6 cm. No fibroids or other mass visualized.

Endometrium

Thickness: 2 mm. Minimal fluid within the endometrial cavity. No
focal abnormality

Right ovary

Measurements: 3.6 x 2.2 x 2.2 cm. Normal appearance/no adnexal mass.

Left ovary

Measurements: 3.1 x 1.4 x 2.3 cm. Normal appearance/no adnexal mass.

Other findings: No free pelvic fluid. Multiple fluid-filled tubular
structures in the pelvis correlate with fluid-filled bowel on
preceding abdominal CT. No convincing hydrosalpinx.
IMPRESSION: Negative pelvic ultrasound.

## 2016-01-04 ENCOUNTER — Emergency Department (HOSPITAL_BASED_OUTPATIENT_CLINIC_OR_DEPARTMENT_OTHER)
Admission: EM | Admit: 2016-01-04 | Discharge: 2016-01-04 | Disposition: A | Discharge status: Home / Self Care | Payer: Medicaid Other | Attending: Physician Assistant | Admitting: Physician Assistant

## 2016-01-04 ENCOUNTER — Encounter (HOSPITAL_BASED_OUTPATIENT_CLINIC_OR_DEPARTMENT_OTHER): Payer: Self-pay | Admitting: Emergency Medicine

## 2016-01-04 DIAGNOSIS — N760 Acute vaginitis: Secondary | ICD-10-CM | POA: Insufficient documentation

## 2016-01-04 DIAGNOSIS — R300 Dysuria: Secondary | ICD-10-CM | POA: Diagnosis present

## 2016-01-04 DIAGNOSIS — Z3202 Encounter for pregnancy test, result negative: Secondary | ICD-10-CM | POA: Diagnosis not present

## 2016-01-04 DIAGNOSIS — B9689 Other specified bacterial agents as the cause of diseases classified elsewhere: Secondary | ICD-10-CM

## 2016-01-04 LAB — WET PREP, GENITAL
SPERM: NONE SEEN
TRICH WET PREP: NONE SEEN
Yeast Wet Prep HPF POC: NONE SEEN

## 2016-01-04 LAB — URINALYSIS, ROUTINE W REFLEX MICROSCOPIC
BILIRUBIN URINE: NEGATIVE
GLUCOSE, UA: NEGATIVE mg/dL
Hgb urine dipstick: NEGATIVE
Ketones, ur: NEGATIVE mg/dL
LEUKOCYTES UA: NEGATIVE
NITRITE: NEGATIVE
PH: 7 (ref 5.0–8.0)
Protein, ur: NEGATIVE mg/dL
Specific Gravity, Urine: 1.033 — ABNORMAL HIGH (ref 1.005–1.030)

## 2016-01-04 LAB — PREGNANCY, URINE: Preg Test, Ur: NEGATIVE

## 2016-01-04 MED ORDER — CEFTRIAXONE SODIUM 250 MG IJ SOLR
250.0000 mg | Freq: Once | INTRAMUSCULAR | Status: AC
  Administered 2016-01-04: 250 mg via INTRAMUSCULAR
  Filled 2016-01-04: qty 250

## 2016-01-04 MED ORDER — AZITHROMYCIN 250 MG PO TABS
1000.0000 mg | ORAL_TABLET | Freq: Once | ORAL | Status: AC
  Administered 2016-01-04: 1000 mg via ORAL
  Filled 2016-01-04: qty 4

## 2016-01-04 MED ORDER — METRONIDAZOLE 500 MG PO TABS: 500.0000 mg | ORAL_TABLET | Freq: Two times a day (BID) | ORAL | Status: DC

## 2016-01-04 NOTE — ED Notes (Signed)
Pt with dysuria and frequency x 1 week

## 2016-01-04 NOTE — ED Provider Notes (Signed)
CSN: [id number]     Arrival date & time 01/04/16  1631 History   First MD Initiated Contact with Patient 01/04/16 1958     Chief Complaint  Patient presents with  . Dysuria     (Consider location/radiation/quality/duration/timing/severity/associated sxs/prior Treatment) Patient is a 25 y.o. female presenting with dysuria. The history is provided by the patient and medical records.  Dysuria Associated symptoms: vaginal discharge     25 year old female with no significant past medical history presenting to the ED for dysuria and urinary frequency for the past week.  She denies any hematuria. No fever, chills, flank pain, or abdominal pain. She has had some vaginal discharge which she describes as white and thick. States she has had some vaginal itching. She has recently had sexual intercourse with former partner from several years ago.  No hx of STD.  States she has had yeast infection in the past, was not sure if that is what it is or a UTI.  No intervention tried PTA.  VSS.  History reviewed. No pertinent past medical history. History reviewed. No pertinent past surgical history. History reviewed. No pertinent family history. Social History  Substance Use Topics  . Smoking status: Never Smoker   . Smokeless tobacco: None  . Alcohol Use: No   OB History    Gravida Para Term Preterm AB TAB SAB Ectopic Multiple Living   Review of Systems  Genitourinary: Positive for dysuria, frequency and vaginal discharge.  All other systems reviewed and are negative.     Allergies  Review of patient's allergies indicates no known allergies.  Home Medications   Prior to Admission medications   Medication Sig Start Date End Date Taking? Authorizing Provider  metroNIDAZOLE (FLAGYL) 500 MG tablet Take 1 tablet (500 mg total) by mouth 2 (two) times daily. One po bid x 7 days 08/11/14   Kathrynn Speed, PA-C  Prenatal Vit-Fe Fumarate-FA (PRENATAL COMPLETE) 14-0.4 MG TABS One a  day, 10/26/12   Elson Areas, PA-C   BP 121/63 mmHg  Pulse 80  Temp(Src) 98.1 F (36.7 C) (Oral)  Resp 18  Ht  (1.626 m)  Wt 81.647 kg  BMI 30.88 kg/m2  SpO2 100%  LMP 12/16/2015   Physical Exam  Constitutional: She is oriented to person, place, and time. She appears well-developed and well-nourished.  HENT:  Head: Normocephalic and atraumatic.  Mouth/Throat: Oropharynx is clear and moist.  Eyes: Conjunctivae and EOM are normal. Pupils are equal, round, and reactive to light.  Neck: Normal range of motion.  Cardiovascular: Normal rate, regular rhythm and normal heart sounds.   Pulmonary/Chest: Effort normal and breath sounds normal. No respiratory distress. She has no wheezes.  Abdominal: Soft. Bowel sounds are normal. There is no tenderness. There is no guarding.  Abdomen soft, nontender  Genitourinary: Cervix exhibits no motion tenderness. Right adnexum displays no tenderness. Left adnexum displays no tenderness. No bleeding in the vagina. No foreign body around the vagina. Vaginal discharge found.  Normal female external genitalia without visible lesions, small amount of white vaginal discharge present, no bleeding or foreign body, cervical os closed, no adnexal or cervical motion tenderness  Musculoskeletal: Normal range of motion.  Neurological: She is alert and oriented to person, place, and time.  Skin: Skin is warm and dry.  Psychiatric: She has a normal mood and affect.  Nursing note and vitals reviewed.   ED Course  Procedures (  including critical care time) Labs Review Labs Reviewed  WET PREP, GENITAL - Abnormal; Notable for the following:    Clue Cells Wet Prep HPF POC PRESENT (*)    WBC, Wet Prep HPF POC FEW (*)    All other components within normal limits  URINALYSIS, ROUTINE W REFLEX MICROSCOPIC (NOT AT [facility]) - Abnormal; Notable for the following:    Specific Gravity, Urine 1.033 (*)    All other components within normal limits  PREGNANCY, URINE   GC/CHLAMYDIA PROBE AMP ([place]) NOT AT [facility]    Imaging Review No results found. I have personally reviewed and evaluated these images and lab results as part of my medical decision-making.   EKG Interpretation None      MDM   Final diagnoses:  Dysuria  Bacterial vaginosis   25 year old female here with dysuria and urinary frequency for the past week. She also reports new vaginal discharge that is white in color. Patient is afebrile, nontoxic. Abdominal exam is benign. UA without signs of infection. Urine pregnancy test is negative. Pelvic exam was performed with small amount of white, thick vaginal discharge present. No adnexal or cervical motion tenderness. Wet prep with clue cells noted. GC/CHL pending.  Patient preferred treatment for STD's today, given rocephin and azithromycin in ED.  Will d/c home with flagyl.  Encouraged not to drink alcohol while taking this.  Follow-up with women's clinic for any ongoing issues.  Discussed plan with patient, he/she acknowledged understanding and agreed with plan of care.  Return precautions given for new or worsening symptoms.  Garlon HatchetLisa M Boruch Manuele, PA-C 01/04/16 2209  Courteney Randall AnLyn Mackuen, MD 01/06/16 0930

## 2016-01-04 NOTE — Discharge Instructions (Signed)
Take the prescribed medication as directed.  Do not drink alcohol while taking this medication as it will make you very sick. You will be notified in the next 24-48 hours if your culture results are positive, however you have already been treated. Follow-up with the women's clinic for any recurrent issues or if symptoms are not improving. Return to the ED for new or worsening symptoms.   Bacterial Vaginosis Bacterial vaginosis is a vaginal infection that occurs when the normal balance of bacteria in the vagina is disrupted. It results from an overgrowth of certain bacteria. This is the most common vaginal infection in women of childbearing age. Treatment is important to prevent complications, especially in pregnant women, as it can cause a premature delivery. CAUSES  Bacterial vaginosis is caused by an increase in harmful bacteria that are normally present in smaller amounts in the vagina. Several different kinds of bacteria can cause bacterial vaginosis. However, the reason that the condition develops is not fully understood. RISK FACTORS Certain activities or behaviors can put you at an increased risk of developing bacterial vaginosis, including:  Having a new sex partner or multiple sex partners.  Douching.  Using an intrauterine device (IUD) for contraception. Women do not get bacterial vaginosis from toilet seats, bedding, swimming pools, or contact with objects around them. SIGNS AND SYMPTOMS  Some women with bacterial vaginosis have no signs or symptoms. Common symptoms include:  Grey vaginal discharge.  A fishlike odor with discharge, especially after sexual intercourse.  Itching or burning of the vagina and vulva.  Burning or pain with urination. DIAGNOSIS  Your health care provider will take a medical history and examine the vagina for signs of bacterial vaginosis. A sample of vaginal fluid may be taken. Your health care provider will look at this sample under a microscope to  check for bacteria and abnormal cells. A vaginal pH test may also be done.  TREATMENT  Bacterial vaginosis may be treated with antibiotic medicines. These may be given in the form of a pill or a vaginal cream. A second round of antibiotics may be prescribed if the condition comes back after treatment. Because bacterial vaginosis increases your risk for sexually transmitted diseases, getting treated can help reduce your risk for chlamydia, gonorrhea, HIV, and herpes. HOME CARE INSTRUCTIONS   Only take over-the-counter or prescription medicines as directed by your health care provider.  If antibiotic medicine was prescribed, take it as directed. Make sure you finish it even if you start to feel better.  Tell all sexual partners that you have a vaginal infection. They should see their health care provider and be treated if they have problems, such as a mild rash or itching.  During treatment, it is important that you follow these instructions:  Avoid sexual activity or use condoms correctly.  Do not douche.  Avoid alcohol as directed by your health care provider.  Avoid breastfeeding as directed by your health care provider. SEEK MEDICAL CARE IF:   Your symptoms are not improving after 3 days of treatment.  You have increased discharge or pain.  You have a fever. MAKE SURE YOU:   Understand these instructions.  Will watch your condition.  Will get help right away if you are not doing well or get worse. FOR MORE INFORMATION  Centers for Disease Control and Prevention, Division of STD Prevention: SolutionApps.co.zawww.cdc.gov/std American Sexual Health Association (ASHA): www.ashastd.org    This information is not intended to replace advice given to you by your health care  provider. Make sure you discuss any questions you have with your health care provider.   Document Released: 10/02/2005 Document Revised: 10/23/2014 Document Reviewed: 05/14/2013 Elsevier Interactive Patient Education [employer].

## 2016-01-05 LAB — GC/CHLAMYDIA PROBE AMP (~~LOC~~) NOT AT ARMC
CHLAMYDIA, DNA PROBE: NEGATIVE
NEISSERIA GONORRHEA: NEGATIVE

## 2021-10-25 ENCOUNTER — Encounter (HOSPITAL_COMMUNITY): Payer: Self-pay

## 2021-10-25 ENCOUNTER — Inpatient Hospital Stay (HOSPITAL_COMMUNITY): Payer: Medicaid Other

## 2021-10-25 ENCOUNTER — Other Ambulatory Visit: Payer: Self-pay

## 2021-10-25 ENCOUNTER — Inpatient Hospital Stay (HOSPITAL_COMMUNITY)
Admission: EM | Admit: 2021-10-25 | Discharge: 2021-10-25 | Disposition: A | Discharge status: Home / Self Care | Payer: Medicaid Other | Attending: Obstetrics and Gynecology | Admitting: Obstetrics and Gynecology

## 2021-10-25 DIAGNOSIS — O209 Hemorrhage in early pregnancy, unspecified: Secondary | ICD-10-CM | POA: Diagnosis not present

## 2021-10-25 DIAGNOSIS — R1030 Lower abdominal pain, unspecified: Secondary | ICD-10-CM | POA: Insufficient documentation

## 2021-10-25 DIAGNOSIS — O26891 Other specified pregnancy related conditions, first trimester: Secondary | ICD-10-CM | POA: Insufficient documentation

## 2021-10-25 DIAGNOSIS — Z3A10 10 weeks gestation of pregnancy: Secondary | ICD-10-CM

## 2021-10-25 DIAGNOSIS — O3680X0 Pregnancy with inconclusive fetal viability, not applicable or unspecified: Secondary | ICD-10-CM | POA: Insufficient documentation

## 2021-10-25 LAB — CBC
HCT: 34.4 % — ABNORMAL LOW (ref 36.0–46.0)
Hemoglobin: 10.3 g/dL — ABNORMAL LOW (ref 12.0–15.0)
MCH: 22.6 pg — ABNORMAL LOW (ref 26.0–34.0)
MCHC: 29.9 g/dL — ABNORMAL LOW (ref 30.0–36.0)
MCV: 75.6 fL — ABNORMAL LOW (ref 80.0–100.0)
Platelets: 318 10*3/uL (ref 150–400)
RBC: 4.55 MIL/uL (ref 3.87–5.11)
RDW: 18.9 % — ABNORMAL HIGH (ref 11.5–15.5)
WBC: 6.7 10*3/uL (ref 4.0–10.5)
nRBC: 0 % (ref 0.0–0.2)

## 2021-10-25 LAB — POC URINE PREG, ED: Preg Test, Ur: POSITIVE — AB

## 2021-10-25 LAB — HCG, QUANTITATIVE, PREGNANCY: hCG, Beta Chain, Quant, S: 2070 m[IU]/mL — ABNORMAL HIGH (ref <5)

## 2021-10-25 LAB — ABO/RH: ABO/RH(D): O POS

## 2021-10-25 NOTE — ED Provider Triage Note (Signed)
Emergency Medicine Provider Triage Evaluation Note  Jaelin Reed-Southerland , a 31 y.o. female  was evaluated in triage.  Pt complains of vaginal bleeding that started yesterday. Had 3 positive preg tests at home last week. Cannot remember LMP but it was either in Nov or Dec.  Review of Systems  Positive: Vaginal bleeding, pelvic cramping Negative: fever  Physical Exam  BP 118/73 (BP Location: Left Arm)    Pulse 98    Temp 99.6 F (37.6 C) (Oral)    Resp 14    Ht 5\' 4"  (1.626 m)    Wt 86.2 kg    SpO2 99%    BMI 32.61 kg/m  Gen:   Awake, no distress   Resp:  Normal effort  MSK:   Moves extremities without difficulty  Other:  Abd soft and nontender  Medical Decision Making  Medically screening exam initiated at 6:03 PM.  Appropriate orders placed.  Denzil Reed-Southerland was informed that the remainder of the evaluation will be completed by another provider, this initial triage assessment does not replace that evaluation, and the importance of remaining in the ED until their evaluation is complete.  6:02 PM Discussed case with Erin at [facility] who accepts patient for transfer if preg test positive   Levora Angel, PA-C 10/25/21 1805

## 2021-10-25 NOTE — [facility] Provider Note (Signed)
History     CSN: [id number]  Arrival date and time: 10/25/21 1734   Event Date/Time   First Provider Initiated Contact with Patient 10/25/21 2146      Chief Complaint  Patient presents with   Vaginal Bleeding   HPI Sheri Baldwin is a 31 y.o. G3P1011 at [redacted]w[redacted]d who presents from [facility] with vaginal bleeding. She reports she had a positive HPT yesterday. At home she had an episode of heavy bright red bleeding that has since stopped. She reports seeing clots with that as well. She reports intermittent lower abdominal cramping that she rates a 4/10. She has not taken anything for the pain.  OB History     Gravida  3   Para  1   Term  1   Preterm      AB  1   Living  1      SAB      IAB      Ectopic      Multiple      Live Births  1           History reviewed. No pertinent past medical history.  History reviewed. No pertinent surgical history.  History reviewed. No pertinent family history.  Social History   Tobacco Use   Smoking status: Never  Substance Use Topics   Alcohol use: No   Drug use: No    Allergies: No Known Allergies  Medications Prior to Admission  Medication Sig Dispense Refill Last Dose   metroNIDAZOLE (FLAGYL) 500 MG tablet Take 1 tablet (500 mg total) by mouth 2 (two) times daily. 14 tablet 0    Prenatal Vit-Fe Fumarate-FA (PRENATAL COMPLETE) 14-0.4 MG TABS One a day, 60 each 1     Review of Systems  Constitutional: Negative.  Negative for fatigue and fever.  HENT: Negative.    Respiratory: Negative.  Negative for shortness of breath.   Cardiovascular: Negative.  Negative for chest pain.  Gastrointestinal:  Positive for abdominal pain. Negative for constipation, diarrhea, nausea and vomiting.  Genitourinary:  Positive for vaginal bleeding. Negative for dysuria.  Neurological: Negative.  Negative for dizziness and headaches.  Physical Exam   Blood pressure 125/61, pulse 82, temperature 99 F (37.2 C), temperature  source Oral, resp. rate 20, height 5\' 3"  (1.6 m), weight 90.4 kg, last menstrual period 08/13/2021, SpO2 99 %, unknown if currently breastfeeding.  Physical Exam Vitals and nursing note reviewed.  Constitutional:      General: She is not in acute distress.    Appearance: She is well-developed.  HENT:     Head: Normocephalic.  Eyes:     Pupils: Pupils are equal, round, and reactive to light.  Cardiovascular:     Rate and Rhythm: Normal rate and regular rhythm.     Heart sounds: Normal heart sounds.  Pulmonary:     Effort: Pulmonary effort is normal. No respiratory distress.     Breath sounds: Normal breath sounds.  Abdominal:     General: Bowel sounds are normal. There is no distension.     Palpations: Abdomen is soft.     Tenderness: There is no abdominal tenderness.  Skin:    General: Skin is warm and dry.  Neurological:     Mental Status: She is alert and oriented to person, place, and time.  Psychiatric:        Mood and Affect: Mood normal.        Behavior: Behavior normal.  Thought Content: Thought content normal.        Judgment: Judgment normal.    [facility] Course  Procedures Results for orders placed or performed during the hospital encounter of 10/25/21 (from the past 24 hour(s))  POC Urine Pregnancy, ED ([facility])     Status: Abnormal   Collection Time: 10/25/21  6:20 PM  Result Value Ref Range   Preg Test, Ur POSITIVE (A) NEGATIVE  CBC     Status: Abnormal   Collection Time: 10/25/21  8:16 PM  Result Value Ref Range   WBC 6.7 4.0 - 10.5 K/uL   RBC 4.55 3.87 - 5.11 MIL/uL   Hemoglobin 10.3 (L) 12.0 - 15.0 g/dL   HCT 62.134.4 (L) 30.836.0 - 65.746.0 %   MCV 75.6 (L) 80.0 - 100.0 fL   MCH 22.6 (L) 26.0 - 34.0 pg   MCHC 29.9 (L) 30.0 - 36.0 g/dL   RDW 84.618.9 (H) 96.211.5 - 95.215.5 %   Platelets 318 150 - 400 K/uL   nRBC 0.0 0.0 - 0.2 %  hCG, quantitative, pregnancy     Status: Abnormal   Collection Time: 10/25/21  8:16 PM  Result Value Ref Range   hCG, Beta Chain, Quant, S  2,070 (H) <5 mIU/mL  ABO/Rh     Status: None   Collection Time: 10/25/21  8:16 PM  Result Value Ref Range   ABO/RH(D) O POS    No rh immune globuloin      NOT A RH IMMUNE GLOBULIN CANDIDATE, PT RH POSITIVE Performed at [facility], [address] [place], [place] [address]     US OB LESS THAN 14 WEEKS WITH OB TRANSVAGINAL  Result Date: 10/25/2021 CLINICAL DATA:  Vaginal bleeding EXAM: OBSTETRIC <14 WK US AND TRANSVAGINAL OB US TECHNIQUE: Both transabdominal and transvaginal ultrasound examinations were performed for complete evaluation of the gestation as well as the maternal uterus, adnexal regions, and pelvic cul-de-sac. Transvaginal technique was performed to assess early pregnancy. COMPARISON:  None. FINDINGS: Intrauterine gestational sac: None Yolk sac:  Not Visualized. Embryo:  Not Visualized. Maternal uterus/adnexae: Ovaries are within normal limits. Left ovary measures 3.8 x 1.7 by 2 cm. The right ovary measures 1.5 x 2.5 x 1.5 cm. No significant free fluid IMPRESSION: No IUP identified. Findings consistent with pregnancy of unknown location, differential of which includes IUP too early to visualize, recent failed pregnancy, and occult ectopic pregnancy. Recommend trending of HCG with repeat ultrasound as indicated Electronically Signed   By: Jasmine PangKim  Fujinaga M.D.   On: 10/25/2021 21:10     MDM UA, UPT CBC, HCG ABO/Rh- O Pos Wet prep and gc/chlamydia- declined US OB Comp Less 14 weeks with Transvaginal  Consulted with Dr. Jolayne Pantheronstant given HCG >1500 and no IUP- ok to have patient return for repeat HCG in 48 hours.   Discussed with client the diagnosis of pregnancy of unknown anatomic location.  Three possibilities of outcome are: a healthy pregnancy that is too early to see a yolk sac to confirm the pregnancy is in the uterus, a pregnancy that is not healthy and has not developed and will not develop, and an ectopic pregnancy that is in the abdomen that cannot be identified at this  time.  And ectopic pregnancy can be a life threatening situation as a pregnancy needs to be in the uterus which is a muscle and can stretch to accommodate the growth of a pregnancy.  Other structures in the pelvis and abdomen as not muscular and  do not stretch with the growth of a pregnancy.  Worst case scenario is that a structure ruptures with a growing pregnancy not in the uterus and and internal hemorrhage can be a life threatening situation.  We need to follow the progression of this pregnancy carefully.  We need to check another serum pregnancy hormone level to determine if the levels are rising appropriately  and to determine the next steps that are needed for you. Patient's questions were answered.   Assessment and Plan   1. Pregnancy of unknown anatomic location   2. Vaginal bleeding affecting early pregnancy   3. [redacted] weeks gestation of pregnancy    -Discharge home in stable condition -Strict ectopic precautions discussed -Patient advised to follow-up with [facility] on Friday 1/13 at 0900, appointment made -Patient may return to [facility] as needed or if her condition were to change or worsen   Rolm Bookbinder CNM 10/25/2021, 9:47 PM

## 2021-10-25 NOTE — ED Triage Notes (Signed)
Pt arrives POV for eval of vaginal bleeding, back pain/abd cramping onset yesterday. Pt had 3 positive home preg tests 1 week ago. LMP beginning of either Nov or Dec.

## 2021-10-25 NOTE — MAU Note (Signed)
PT IS UNSURE OF LMP-  HAD POSITIVE HPT YESTERDAY - BLACK D/C AND TODAY  HEAVY RED CYCLE AND CLOTS  HAS TAMPON  IN - X1 HR  CRAMPING - YESTERDAY 7-8

## 2021-10-25 NOTE — Discharge Instructions (Signed)

## 2021-10-28 ENCOUNTER — Other Ambulatory Visit: Payer: Self-pay

## 2021-10-28 ENCOUNTER — Other Ambulatory Visit (INDEPENDENT_AMBULATORY_CARE_PROVIDER_SITE_OTHER): Payer: PRIVATE HEALTH INSURANCE | Admitting: *Deleted

## 2021-10-28 VITALS — BP 127/57 | HR 90 | Ht 63.0 in | Wt 199.2 lb

## 2021-10-28 DIAGNOSIS — O3680X0 Pregnancy with inconclusive fetal viability, not applicable or unspecified: Secondary | ICD-10-CM

## 2021-10-28 DIAGNOSIS — O039 Complete or unspecified spontaneous abortion without complication: Secondary | ICD-10-CM

## 2021-10-28 LAB — BETA HCG QUANT (REF LAB): hCG Quant: 169 m[IU]/mL

## 2021-10-28 NOTE — Progress Notes (Signed)
Stat bhcg results received=169 and reviewed with Dr. Crissie Reese who confirms this confirms miscarriage. Orders for non stat bhcg weekly until 0 and offer sab fu provider in 2 weeks.  I called Adie and gave her results and recommendations. I also gave her support. She agreed to lab appointment 11/04/21. I explained registrar will call her with provider appointment. I also advised no intercourse or use condoms until bhcg =0. She voices understanding. Salome Cozby,RN

## 2021-10-28 NOTE — Progress Notes (Signed)
Chart reviewed for nurse visit. Agree with plan of care.  ° °Diron Haddon M, MD °10/28/21 2:02 PM °

## 2021-10-28 NOTE — Progress Notes (Signed)
Here for stat bhcg. States pain is less than it was when at hospital but is still=5,6. States bleeding. Is lighter, now  changing every 2 hours and is full.   Explained we will draw stat bhcg and have her leave office. She will be called with results in about 2 hours after results received and reviewed by provider. Also informed her about [facility] services if needed . She voices understanding.   Braxston Quinter,RN

## 2021-10-30 ENCOUNTER — Telehealth: Payer: Self-pay | Admitting: Radiology

## 2021-10-30 NOTE — Telephone Encounter (Signed)
Spoke with patient, informed of SAB FU appointment scheduled at [facility] 11/11/21 @ 10:55 with Dr Crissie Reese

## 2021-11-04 ENCOUNTER — Other Ambulatory Visit: Payer: PRIVATE HEALTH INSURANCE

## 2021-11-11 ENCOUNTER — Ambulatory Visit: Payer: PRIVATE HEALTH INSURANCE | Admitting: Family Medicine

## 2022-03-29 NOTE — Progress Notes (Signed)
Erroneous encounter-disregard

## 2022-04-05 ENCOUNTER — Encounter: Payer: Medicaid Other | Admitting: Family

## 2022-04-05 DIAGNOSIS — Z7689 Persons encountering health services in other specified circumstances: Secondary | ICD-10-CM

## 2022-05-29 ENCOUNTER — Ambulatory Visit: Payer: Medicaid Other | Admitting: Family

## 2023-01-29 ENCOUNTER — Other Ambulatory Visit: Payer: Self-pay

## 2023-01-29 ENCOUNTER — Ambulatory Visit (HOSPITAL_COMMUNITY)
Admission: EM | Admit: 2023-01-29 | Discharge: 2023-01-29 | Disposition: A | Discharge status: Home / Self Care | Payer: Medicaid Other | Attending: Urology | Admitting: Urology

## 2023-01-29 DIAGNOSIS — F332 Major depressive disorder, recurrent severe without psychotic features: Secondary | ICD-10-CM | POA: Diagnosis not present

## 2023-01-29 DIAGNOSIS — Z1152 Encounter for screening for COVID-19: Secondary | ICD-10-CM | POA: Diagnosis not present

## 2023-01-29 DIAGNOSIS — R45851 Suicidal ideations: Secondary | ICD-10-CM | POA: Insufficient documentation

## 2023-01-29 DIAGNOSIS — Z62810 Personal history of physical and sexual abuse in childhood: Secondary | ICD-10-CM | POA: Insufficient documentation

## 2023-01-29 DIAGNOSIS — F1090 Alcohol use, unspecified, uncomplicated: Secondary | ICD-10-CM | POA: Insufficient documentation

## 2023-01-29 LAB — COMPREHENSIVE METABOLIC PANEL
ALT: 13 U/L (ref 0–44)
AST: 19 U/L (ref 15–41)
Albumin: 4.1 g/dL (ref 3.5–5.0)
Alkaline Phosphatase: 50 U/L (ref 38–126)
Anion gap: 14 (ref 5–15)
BUN: 8 mg/dL (ref 6–20)
CO2: 18 mmol/L — ABNORMAL LOW (ref 22–32)
Calcium: 9 mg/dL (ref 8.9–10.3)
Chloride: 108 mmol/L (ref 98–111)
Creatinine, Ser: 1.08 mg/dL — ABNORMAL HIGH (ref 0.44–1.00)
GFR, Estimated: >60 mL/min (ref >60)
Glucose, Bld: 117 mg/dL — ABNORMAL HIGH (ref 70–99)
Potassium: 3.6 mmol/L (ref 3.5–5.1)
Sodium: 140 mmol/L (ref 135–145)
Total Bilirubin: 0.7 mg/dL (ref 0.3–1.2)
Total Protein: 7.7 g/dL (ref 6.5–8.1)

## 2023-01-29 LAB — CBC WITH DIFFERENTIAL/PLATELET
Abs Immature Granulocytes: 0.03 10*3/uL (ref 0.00–0.07)
Basophils Absolute: 0.1 10*3/uL (ref 0.0–0.1)
Basophils Relative: 2 %
Eosinophils Absolute: 0 10*3/uL (ref 0.0–0.5)
Eosinophils Relative: 0 %
HCT: 34.5 % — ABNORMAL LOW (ref 36.0–46.0)
Hemoglobin: 11 g/dL — ABNORMAL LOW (ref 12.0–15.0)
Immature Granulocytes: 1 %
Lymphocytes Relative: 36 %
Lymphs Abs: 2 10*3/uL (ref 0.7–4.0)
MCH: 24 pg — ABNORMAL LOW (ref 26.0–34.0)
MCHC: 31.9 g/dL (ref 30.0–36.0)
MCV: 75.3 fL — ABNORMAL LOW (ref 80.0–100.0)
Monocytes Absolute: 0.4 10*3/uL (ref 0.1–1.0)
Monocytes Relative: 7 %
Neutro Abs: 2.9 10*3/uL (ref 1.7–7.7)
Neutrophils Relative %: 54 %
Platelets: 365 10*3/uL (ref 150–400)
RBC: 4.58 MIL/uL (ref 3.87–5.11)
RDW: 16.9 % — ABNORMAL HIGH (ref 11.5–15.5)
WBC: 5.4 10*3/uL (ref 4.0–10.5)
nRBC: 0 % (ref 0.0–0.2)

## 2023-01-29 LAB — LIPID PANEL
Cholesterol: 180 mg/dL (ref 0–200)
HDL: 59 mg/dL (ref >40)
LDL Cholesterol: 98 mg/dL (ref 0–99)
Total CHOL/HDL Ratio: 3.1 RATIO
Triglycerides: 113 mg/dL (ref <150)
VLDL: 23 mg/dL (ref 0–40)

## 2023-01-29 LAB — POCT URINE DRUG SCREEN - MANUAL ENTRY (I-SCREEN)
POC Amphetamine UR: NOT DETECTED
POC Buprenorphine (BUP): NOT DETECTED
POC Cocaine UR: NOT DETECTED
POC Marijuana UR: POSITIVE — AB
POC Methadone UR: NOT DETECTED
POC Methamphetamine UR: NOT DETECTED
POC Morphine: NOT DETECTED
POC Oxazepam (BZO): NOT DETECTED
POC Oxycodone UR: NOT DETECTED
POC Secobarbital (BAR): NOT DETECTED

## 2023-01-29 LAB — HEMOGLOBIN A1C
Hgb A1c MFr Bld: 5.4 % (ref 4.8–5.6)
Mean Plasma Glucose: 108.28 mg/dL

## 2023-01-29 LAB — SARS CORONAVIRUS 2 BY RT PCR: SARS Coronavirus 2 by RT PCR: NEGATIVE

## 2023-01-29 LAB — PREGNANCY, URINE: Preg Test, Ur: NEGATIVE

## 2023-01-29 LAB — TSH: TSH: 0.502 u[IU]/mL (ref 0.350–4.500)

## 2023-01-29 LAB — ETHANOL: Alcohol, Ethyl (B): 204 mg/dL — ABNORMAL HIGH (ref <10)

## 2023-01-29 MED ORDER — LORAZEPAM 1 MG PO TABS: 1.0000 mg | ORAL_TABLET | Freq: Four times a day (QID) | ORAL | Status: DC | PRN

## 2023-01-29 MED ORDER — THIAMINE HCL 100 MG/ML IJ SOLN
100.0000 mg | Freq: Once | INTRAMUSCULAR | Status: DC
  Filled 2023-01-29: qty 2

## 2023-01-29 MED ORDER — ADULT MULTIVITAMIN W/MINERALS CH
1.0000 | ORAL_TABLET | Freq: Every day | ORAL | Status: DC
  Administered 2023-01-29: 1 via ORAL
  Filled 2023-01-29: qty 1

## 2023-01-29 MED ORDER — MAGNESIUM HYDROXIDE 400 MG/5ML PO SUSP: 30.0000 mL | Freq: Every day | ORAL | Status: DC | PRN

## 2023-01-29 MED ORDER — THIAMINE MONONITRATE 100 MG PO TABS: 100.0000 mg | ORAL_TABLET | Freq: Every day | ORAL | Status: DC

## 2023-01-29 MED ORDER — TRAZODONE HCL 50 MG PO TABS
50.0000 mg | ORAL_TABLET | Freq: Every evening | ORAL | Status: DC | PRN
  Administered 2023-01-29: 50 mg via ORAL
  Filled 2023-01-29: qty 1

## 2023-01-29 MED ORDER — SERTRALINE HCL 25 MG PO TABS
25.0000 mg | ORAL_TABLET | Freq: Every day | ORAL | Status: DC
  Administered 2023-01-29: 25 mg via ORAL
  Filled 2023-01-29: qty 1

## 2023-01-29 MED ORDER — ALUM & MAG HYDROXIDE-SIMETH 200-200-20 MG/5ML PO SUSP: 30.0000 mL | ORAL | Status: DC | PRN

## 2023-01-29 MED ORDER — ACETAMINOPHEN 325 MG PO TABS: 650.0000 mg | ORAL_TABLET | Freq: Four times a day (QID) | ORAL | Status: DC | PRN

## 2023-01-29 MED ORDER — HYDROXYZINE HCL 25 MG PO TABS
25.0000 mg | ORAL_TABLET | Freq: Three times a day (TID) | ORAL | Status: DC | PRN
  Administered 2023-01-29: 25 mg via ORAL
  Filled 2023-01-29: qty 1

## 2023-01-29 MED ORDER — LOPERAMIDE HCL 2 MG PO CAPS: 2.0000 mg | ORAL_CAPSULE | ORAL | Status: DC | PRN

## 2023-01-29 MED ORDER — ONDANSETRON 4 MG PO TBDP: 4.0000 mg | ORAL_TABLET | Freq: Four times a day (QID) | ORAL | Status: DC | PRN

## 2023-01-29 NOTE — ED Notes (Signed)
Pt A&O x 4, presents with suicidal ideations, plan to jump off bridge.  Stressors are loss of her mother and poor relationship with daughter.  Comfort measures given.  No distress noted.  Monitoring for safety.

## 2023-01-29 NOTE — ED Notes (Signed)
Patient is currently on the phone no distress noted, will continue to monitor for safety

## 2023-01-29 NOTE — Progress Notes (Signed)
   01/29/23 0115  [facility] Triage Screening (Walk-ins at [facility] only)  How Did You Hear About [place]? Legal System  What Is the Reason for Your Visit/Call Today? Pt presents to [facility] voluntarily, via law enforcement due to suicidal thoughts with a plan to jump off a bridge. Pt reports being overwhelmed with life, the loss of her mother and poor relationship with her daughter. Pt states she was walking tonight and called her family to tell them that it was over and she was tired. Pt's family called law enforcement and picked her up near [place] Pt reports having ongoing suicidal thoughts most of her life, but tonight was the first time she ever considered really ending her life. Pt reports drinking mixed cocktails earlier tonight and admits to having more than she should have. Pt denies HI, AVH.  How Long Has This Been Causing You Problems? <Week  Have You Recently Had Any Thoughts About Hurting Yourself? Yes  How long ago did you have thoughts about hurting yourself? about 30 minutes ago when I planned to jump from a bridge  Are You Planning to Commit Suicide/Harm Yourself At This time? No  Have you Recently Had Thoughts About Hurting Someone Karolee Ohs? No  Are You Planning To Harm Someone At This Time? No  Are you currently experiencing any auditory, visual or other hallucinations? No  Have You Used Any Alcohol or Drugs in the Past 24 Hours? Yes  How long ago did you use Drugs or Alcohol? earlier tonight  What Did You Use and How Much? a few mixed cocktails  Do you have any current medical co-morbidities that require immediate attention? No  Clinician description of patient physical appearance/behavior: pt is cooperative, calm  What Do You Feel Would Help You the Most Today? Treatment for Depression or other mood problem  If access to [facility] was not available, would you have sought care in the Emergency Department? No  Determination of Need Urgent (48 hours)  Options For Referral Other:  Comment;BH Urgent Care;Outpatient Therapy;Medication Management

## 2023-01-29 NOTE — Discharge Instructions (Signed)
Follow-up recommendations:  Activity:  Normal, as tolerated Diet:  Per PCP recommendation  Patient has been instructed & cautioned: To not engage in alcohol and or illegal drug use while on prescription medicines.  In the event of worsening symptoms, patient is instructed to call the crisis hotline at 988, 911 and or go to the nearest ED for appropriate evaluation and treatment of symptoms. To follow-up with her primary care provider for your other medical issues, concerns and or health care needs.   For [facility] residents who are uninsured or have Medicare/Medicaid:  Please come to [facility] (this facility) during walk in hours for appointment with psychiatrist for further medication management and for therapists for therapy.    Walk in hours are 8-11 AM Monday through Thursday for medication management.Child and adolescent psychiatrists are only available on Wednesdays and Thursdays during walk in hours.  Therapy walk in hours are Monday-Wednesday 8 AM-1PM.   It is first come, first -serve; it is best to arrive by 7:00 AM.   On Friday from 1 pm to 4 pm for therapy intake only. Please arrive by 12:00 pm as it is  first come, first -serve.    When you arrive please go upstairs for your appointment. If you are unsure of where to go, inform the front desk that you are here for a walk in appointment and they will assist you with directions upstairs.  Address:  [address] in [place], [address] Ph: [phone]
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
## 2023-01-29 NOTE — ED Notes (Signed)
Pt discharged to lobby, being transported home by [facility] bird transport.
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
## 2023-10-17 NOTE — L&D Delivery Note (Signed)
 Delivery Note ROM x 3h 14m. At 11:25 AM a vigorous viable and healthy female was delivered via Vaginal, Spontaneous (Presentation:   Occiput Anterior). Shoulders delivered easily. Infant placed skin-to-skin w/ mom. Delayed cord clamping x 1 minute. Cord clamped x 2 and cut by Student midwife. NICU team present due to unknown gestational age and decels in second stage, but baby was vigorous and able to stay w/ mom.  APGAR: 8, 9; weight pending.   Placenta status: Spontaneous, Intact.  Cord: 3 vessels with the following complications: body cord.  Cord pH: NA  Anesthesia: Epidural Episiotomy: None Lacerations: Small 1st degree vaginal laceration. Hemostatic. Repair not indicated.  Suture Repair: NA Est. Blood Loss (mL): 120  Mom to postpartum.  Baby to Couplet care / Skin to Skin. Placenta to: Path Feeding: Breast Circ: NA Contraception: undecided  Anemia Hbg 8.2  - Pitocin  and TXA given prophylactically. Bleeding minimal. - PO iron - CBC in am  No prenatal care - explained drug screening policy. Recommend UDS for pt. Ver consent obtained.  - SW consult.   Poluhydramnios - Add on Hgb A1C  Sheri Baldwin 09/10/2024, 12:17 PM

## 2024-09-10 ENCOUNTER — Encounter (HOSPITAL_COMMUNITY): Payer: Self-pay | Admitting: Obstetrics & Gynecology

## 2024-09-10 ENCOUNTER — Inpatient Hospital Stay (HOSPITAL_COMMUNITY): Admitting: Anesthesiology

## 2024-09-10 ENCOUNTER — Inpatient Hospital Stay (HOSPITAL_COMMUNITY)
Admission: AD | Admit: 2024-09-10 | Discharge: 2024-09-12 | DRG: 806 | Disposition: A | Discharge status: Home / Self Care | Attending: Obstetrics & Gynecology | Admitting: Obstetrics & Gynecology

## 2024-09-10 ENCOUNTER — Other Ambulatory Visit: Payer: Self-pay

## 2024-09-10 DIAGNOSIS — D62 Acute posthemorrhagic anemia: Secondary | ICD-10-CM | POA: Diagnosis not present

## 2024-09-10 DIAGNOSIS — O0930 Supervision of pregnancy with insufficient antenatal care, unspecified trimester: Secondary | ICD-10-CM

## 2024-09-10 DIAGNOSIS — O26893 Other specified pregnancy related conditions, third trimester: Secondary | ICD-10-CM | POA: Diagnosis present

## 2024-09-10 DIAGNOSIS — Z3A37 37 weeks gestation of pregnancy: Secondary | ICD-10-CM | POA: Diagnosis not present

## 2024-09-10 DIAGNOSIS — O0933 Supervision of pregnancy with insufficient antenatal care, third trimester: Secondary | ICD-10-CM | POA: Diagnosis not present

## 2024-09-10 DIAGNOSIS — Z3A00 Weeks of gestation of pregnancy not specified: Secondary | ICD-10-CM | POA: Diagnosis not present

## 2024-09-10 DIAGNOSIS — O9081 Anemia of the puerperium: Secondary | ICD-10-CM | POA: Diagnosis not present

## 2024-09-10 DIAGNOSIS — O403XX0 Polyhydramnios, third trimester, not applicable or unspecified: Secondary | ICD-10-CM | POA: Diagnosis present

## 2024-09-10 LAB — LIPID PANEL
Cholesterol: 243 mg/dL — ABNORMAL HIGH (ref 0–200)
HDL: 78 mg/dL (ref >40)
LDL Cholesterol: 141 mg/dL — ABNORMAL HIGH (ref 0–99)
Total CHOL/HDL Ratio: 3.1 ratio
Triglycerides: 121 mg/dL (ref <150)
VLDL: 24 mg/dL (ref 0–40)

## 2024-09-10 LAB — CBC
HCT: 30.4 % — ABNORMAL LOW (ref 36.0–46.0)
Hemoglobin: 8.5 g/dL — ABNORMAL LOW (ref 12.0–15.0)
MCH: 19.9 pg — ABNORMAL LOW (ref 26.0–34.0)
MCHC: 28 g/dL — ABNORMAL LOW (ref 30.0–36.0)
MCV: 71 fL — ABNORMAL LOW (ref 80.0–100.0)
Platelets: 267 K/uL (ref 150–400)
RBC: 4.28 MIL/uL (ref 3.87–5.11)
RDW: 20.5 % — ABNORMAL HIGH (ref 11.5–15.5)
WBC: 11.9 K/uL — ABNORMAL HIGH (ref 4.0–10.5)
nRBC: 0.3 % — ABNORMAL HIGH (ref 0.0–0.2)

## 2024-09-10 LAB — RAPID URINE DRUG SCREEN, HOSP PERFORMED
Amphetamines: NOT DETECTED
Barbiturates: NOT DETECTED
Benzodiazepines: NOT DETECTED
Cocaine: NOT DETECTED
Opiates: NOT DETECTED
Tetrahydrocannabinol: NOT DETECTED

## 2024-09-10 LAB — GROUP B STREP BY PCR: Group B strep by PCR: NEGATIVE

## 2024-09-10 LAB — HIV ANTIBODY (ROUTINE TESTING W REFLEX)
HIV Screen 4th Generation wRfx: NONREACTIVE
HIV Screen 4th Generation wRfx: NONREACTIVE

## 2024-09-10 LAB — TYPE AND SCREEN
ABO/RH(D): O POS
Antibody Screen: NEGATIVE

## 2024-09-10 LAB — POCT FERN TEST: POCT Fern Test: POSITIVE

## 2024-09-10 LAB — SYPHILIS: RPR W/REFLEX TO RPR TITER AND TREPONEMAL ANTIBODIES, TRADITIONAL SCREENING AND DIAGNOSIS ALGORITHM: RPR Ser Ql: NONREACTIVE

## 2024-09-10 MED ORDER — TRANEXAMIC ACID-NACL 1000-0.7 MG/100ML-% IV SOLN: 1000.0000 mg | Freq: Once | INTRAVENOUS | Status: AC

## 2024-09-10 MED ORDER — LIDOCAINE HCL (PF) 1 % IJ SOLN
INTRAMUSCULAR | Status: DC | PRN
  Administered 2024-09-10 (×2): 4 mL via EPIDURAL

## 2024-09-10 MED ORDER — SODIUM CHLORIDE 0.9 % IV SOLN
5.0000 10*6.[IU] | Freq: Once | INTRAVENOUS | Status: AC
  Administered 2024-09-10: 5 10*6.[IU] via INTRAVENOUS
  Filled 2024-09-10: qty 5

## 2024-09-10 MED ORDER — MAGNESIUM HYDROXIDE 400 MG/5ML PO SUSP: 30.0000 mL | ORAL | Status: DC | PRN

## 2024-09-10 MED ORDER — OXYTOCIN-SODIUM CHLORIDE 30-0.9 UT/500ML-% IV SOLN
2.5000 [IU]/h | INTRAVENOUS | Status: DC
  Administered 2024-09-10: 2.5 [IU]/h via INTRAVENOUS
  Filled 2024-09-10: qty 500

## 2024-09-10 MED ORDER — DIBUCAINE (PERIANAL) 1 % EX OINT: 1.0000 | TOPICAL_OINTMENT | CUTANEOUS | Status: DC | PRN

## 2024-09-10 MED ORDER — SOD CITRATE-CITRIC ACID 500-334 MG/5ML PO SOLN: 30.0000 mL | ORAL | Status: DC | PRN

## 2024-09-10 MED ORDER — WITCH HAZEL-GLYCERIN EX PADS: 1.0000 | MEDICATED_PAD | CUTANEOUS | Status: DC | PRN

## 2024-09-10 MED ORDER — IBUPROFEN 600 MG PO TABS
600.0000 mg | ORAL_TABLET | Freq: Four times a day (QID) | ORAL | Status: DC
  Administered 2024-09-10 – 2024-09-12 (×8): 600 mg via ORAL
  Filled 2024-09-10 (×7): qty 1

## 2024-09-10 MED ORDER — ONDANSETRON HCL 4 MG PO TABS: 4.0000 mg | ORAL_TABLET | ORAL | Status: DC | PRN

## 2024-09-10 MED ORDER — OXYTOCIN BOLUS FROM INFUSION
333.0000 mL | Freq: Once | INTRAVENOUS | Status: AC
  Administered 2024-09-10: 333 mL via INTRAVENOUS

## 2024-09-10 MED ORDER — OXYCODONE HCL 5 MG PO TABS: 10.0000 mg | ORAL_TABLET | ORAL | Status: DC | PRN

## 2024-09-10 MED ORDER — ONDANSETRON HCL 4 MG/2ML IJ SOLN: 4.0000 mg | INTRAMUSCULAR | Status: DC | PRN

## 2024-09-10 MED ORDER — LACTATED RINGERS IV SOLN: 500.0000 mL | INTRAVENOUS | Status: DC | PRN

## 2024-09-10 MED ORDER — DIPHENHYDRAMINE HCL 50 MG/ML IJ SOLN: 12.5000 mg | INTRAMUSCULAR | Status: DC | PRN

## 2024-09-10 MED ORDER — PRENATAL MULTIVITAMIN CH
1.0000 | ORAL_TABLET | Freq: Every day | ORAL | Status: DC
  Administered 2024-09-11 – 2024-09-12 (×2): 1 via ORAL
  Filled 2024-09-10: qty 1

## 2024-09-10 MED ORDER — FENTANYL-BUPIVACAINE-NACL 0.5-0.125-0.9 MG/250ML-% EP SOLN
12.0000 mL/h | EPIDURAL | Status: DC | PRN
  Administered 2024-09-10: 12 mL/h via EPIDURAL
  Filled 2024-09-10: qty 250

## 2024-09-10 MED ORDER — ONDANSETRON HCL 4 MG/2ML IJ SOLN: 4.0000 mg | Freq: Four times a day (QID) | INTRAMUSCULAR | Status: DC | PRN

## 2024-09-10 MED ORDER — COCONUT OIL OIL: 1.0000 | TOPICAL_OIL | Status: DC | PRN

## 2024-09-10 MED ORDER — DIPHENHYDRAMINE HCL 25 MG PO CAPS: 25.0000 mg | ORAL_CAPSULE | Freq: Four times a day (QID) | ORAL | Status: DC | PRN

## 2024-09-10 MED ORDER — PHENYLEPHRINE 80 MCG/ML (10ML) SYRINGE FOR IV PUSH (FOR BLOOD PRESSURE SUPPORT)
80.0000 ug | PREFILLED_SYRINGE | INTRAVENOUS | Status: DC | PRN
  Filled 2024-09-10: qty 10

## 2024-09-10 MED ORDER — MEASLES, MUMPS & RUBELLA VAC ~~LOC~~ SUSR: 0.5000 mL | Freq: Once | SUBCUTANEOUS | Status: DC

## 2024-09-10 MED ORDER — OXYCODONE HCL 5 MG PO TABS: 5.0000 mg | ORAL_TABLET | ORAL | Status: DC | PRN

## 2024-09-10 MED ORDER — ACETAMINOPHEN 325 MG PO TABS: 650.0000 mg | ORAL_TABLET | ORAL | Status: DC | PRN

## 2024-09-10 MED ORDER — TRANEXAMIC ACID-NACL 1000-0.7 MG/100ML-% IV SOLN
INTRAVENOUS | Status: AC
  Administered 2024-09-10: 1000 mg via INTRAVENOUS
  Filled 2024-09-10: qty 100

## 2024-09-10 MED ORDER — TETANUS-DIPHTH-ACELL PERTUSSIS 5-2-15.5 LF-MCG/0.5 IM SUSP: 0.5000 mL | Freq: Once | INTRAMUSCULAR | Status: DC

## 2024-09-10 MED ORDER — FERROUS SULFATE 325 (65 FE) MG PO TABS
325.0000 mg | ORAL_TABLET | Freq: Every day | ORAL | Status: DC
Start: 2024-09-11 — End: 2024-09-12
  Administered 2024-09-11: 325 mg via ORAL
  Filled 2024-09-10: qty 1

## 2024-09-10 MED ORDER — PENICILLIN G POT IN DEXTROSE 60000 UNIT/ML IV SOLN: 3.0000 10*6.[IU] | INTRAVENOUS | Status: DC

## 2024-09-10 MED ORDER — SIMETHICONE 80 MG PO CHEW: 80.0000 mg | CHEWABLE_TABLET | ORAL | Status: DC | PRN

## 2024-09-10 MED ORDER — PHENYLEPHRINE 80 MCG/ML (10ML) SYRINGE FOR IV PUSH (FOR BLOOD PRESSURE SUPPORT): 80.0000 ug | PREFILLED_SYRINGE | INTRAVENOUS | Status: DC | PRN

## 2024-09-10 MED ORDER — BENZOCAINE-MENTHOL 20-0.5 % EX AERO
1.0000 | INHALATION_SPRAY | CUTANEOUS | Status: DC | PRN
  Administered 2024-09-10: 1 via TOPICAL
  Filled 2024-09-10: qty 56

## 2024-09-10 MED ORDER — EPHEDRINE 5 MG/ML INJ
10.0000 mg | INTRAVENOUS | Status: DC | PRN
  Filled 2024-09-10: qty 5

## 2024-09-10 MED ORDER — EPHEDRINE 5 MG/ML INJ: 10.0000 mg | INTRAVENOUS | Status: DC | PRN

## 2024-09-10 MED ORDER — LACTATED RINGERS IV SOLN: 500.0000 mL | Freq: Once | INTRAVENOUS | Status: DC

## 2024-09-10 MED ORDER — LIDOCAINE HCL (PF) 1 % IJ SOLN: 30.0000 mL | INTRAMUSCULAR | Status: DC | PRN

## 2024-09-10 MED ORDER — LACTATED RINGERS IV SOLN: INTRAVENOUS | Status: DC

## 2024-09-10 MED ORDER — ACETAMINOPHEN 325 MG PO TABS
650.0000 mg | ORAL_TABLET | ORAL | Status: DC | PRN
  Administered 2024-09-11: 650 mg via ORAL
  Filled 2024-09-10: qty 2

## 2024-09-10 NOTE — Anesthesia Preprocedure Evaluation (Signed)
 Anesthesia Evaluation  Patient identified by MRN, date of birth, ID band Patient awake    Reviewed: Allergy & Precautions, NPO status , Patient's Chart, lab work & pertinent test results  History of Anesthesia Complications Negative for: history of anesthetic complications  Airway Mallampati: III  TM Distance: >3 FB Neck ROM: Full    Dental   Pulmonary neg pulmonary ROS   Pulmonary exam normal breath sounds clear to auscultation       Cardiovascular negative cardio ROS  Rhythm:Regular Rate:Normal     Neuro/Psych negative neurological ROS     GI/Hepatic negative GI ROS, Neg liver ROS,,,  Endo/Other  negative endocrine ROS    Renal/GU negative Renal ROS     Musculoskeletal   Abdominal   Peds  Hematology  (+) Blood dyscrasia, anemia Lab Results      Component                Value               Date                      WBC                      11.9 (H)            09/10/2024                HGB                      8.5 (L)             09/10/2024                HCT                      30.4 (L)            09/10/2024                MCV                      71.0 (L)            09/10/2024                PLT                      267                 09/10/2024              Anesthesia Other Findings   Reproductive/Obstetrics (+) Pregnancy Did not receive prenatal care, unknown gestation                              Anesthesia Physical Anesthesia Plan  ASA: 2  Anesthesia Plan: Epidural   Post-op Pain Management:    Induction:   PONV Risk Score and Plan:   Airway Management Planned: Natural Airway  Additional Equipment:   Intra-op Plan:   Post-operative Plan:   Informed Consent: I have reviewed the patients History and Physical, chart, labs and discussed the procedure including the risks, benefits and alternatives for the proposed anesthesia with the patient or authorized  representative who has indicated his/her understanding and acceptance.       Plan Discussed with: Anesthesiologist  Anesthesia Plan  Comments: (I have discussed risks of neuraxial anesthesia including but not limited to infection, bleeding, nerve injury, back pain, headache, seizures, and failure of block. Patient denies bleeding disorders and is not currently anticoagulated. Labs have been reviewed. Risks and benefits discussed. All patient's questions answered.  )         Anesthesia Quick Evaluation

## 2024-09-10 NOTE — Anesthesia Procedure Notes (Signed)
 Epidural Patient location during procedure: OB Start time: 09/10/2024 8:58 AM End time: 09/10/2024 9:03 AM  Staffing Anesthesiologist: Peggye Delon Brunswick, MD Performed: anesthesiologist   Preanesthetic Checklist Completed: patient identified, IV checked, risks and benefits discussed, monitors and equipment checked, pre-op evaluation and timeout performed  Epidural Patient position: sitting Prep: DuraPrep and site prepped and draped Patient monitoring: continuous pulse ox and blood pressure Approach: midline Location: L3-L4 Injection technique: LOR saline  Needle:  Needle type: Tuohy  Needle gauge: 17 G Needle length: 9 cm and 9 Needle insertion depth: 6 cm Catheter type: closed end flexible Catheter size: 19 Gauge Catheter at skin depth: 10 cm Test dose: negative  Assessment Events: blood not aspirated, no cerebrospinal fluid, injection not painful, no injection resistance, no paresthesia and negative IV test  Additional Notes The patient has requested an epidural for labor pain management. Risks and benefits including, but not limited to, infection, bleeding, local anesthetic toxicity, headache, hypotension, back pain, block failure, etc. were discussed with the patient. The patient expressed understanding and consented to the procedure. I confirmed that the patient has no bleeding disorders and is not taking blood thinners. I confirmed the patient's last platelet count with the nurse. A time-out was performed immediately prior to the procedure. Please see nursing documentation for vital signs. Sterile technique was used throughout the whole procedure. Once LOR achieved, the epidural catheter threaded easily without resistance. Aspiration of the catheter was negative for blood and CSF. The epidural was dosed slowly and an infusion was started.  1 attempt(s)Reason for block:procedure for pain
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
## 2024-09-10 NOTE — Discharge Summary (Signed)
 Postpartum Discharge Summary     Patient Name: Sheri Baldwin DOB: 1991/09/20 MRN: [id number]  Date of admission: 09/10/2024 Delivery date:09/10/2024 Delivering provider: DUREL CONARD DEL Date of discharge: 09/12/2024  Admitting diagnosis: Labor and delivery, indication for care [O75.9] Intrauterine pregnancy: Unknown     Secondary diagnosis:  Active Problems:   No prenatal care in current pregnancy   Vaginal delivery   Anemia associated with acute blood loss  Additional problems: acute blood loss postpartum anemia    Discharge diagnosis: Term Pregnancy Delivered                                              Post partum procedures:IV venofer  infusion Augmentation: none Complications: None  Hospital course: Onset of Labor With Vaginal Delivery      33 y.o. yo [id number] at [redacted]w[redacted]d was admitted in Active Labor on 09/10/2024. No prenatal care due to no know she was pregnant.  Labor course was complicated by variable decels.  Membrane Rupture Time/Date: 7:41 AM,09/10/2024  Delivery Method:Vaginal, Spontaneous Operative Delivery:N/A Episiotomy: None Lacerations:  1st degree Patient had a postpartum course complicated by clinically significant acute blood loss anemia requiring Venofer  infusion.  Social work consult identified no barriers to discharge. She is ambulating, tolerating a regular diet, passing flatus, and urinating well. Patient is discharged home in stable condition on 09/12/24.  Newborn Data: Birth date:09/10/2024 Birth time:11:25 AM Gender:Female Living status:Living Apgars:8 ,9  Weight:3350 g  Magnesium  Sulfate received: No BMZ received: No Rhophylac:N/A MMR: Rubella screen pending at discharge T-DaP:offered prior to discharge Flu: offered prior to discharge RSV Vaccine received: No Transfusion:No  Immunizations received: There is no immunization history for the selected administration types on file for this patient.  Physical exam  Vitals:    09/11/24 1535 09/11/24 2110 09/12/24 0505 09/12/24 0645  BP: 128/69 122/72 (!) 104/59 119/71  Pulse: 89 91    Resp: 20 18 16 16   Temp: 98 F (36.7 C) 97.9 F (36.6 C) 98.2 F (36.8 C) 98.3 F (36.8 C)  TempSrc: Oral Axillary Oral Oral  SpO2: 100% 99% 97% 99%  Height:       General: alert, cooperative, and no distress Lochia: appropriate Uterine Fundus: firm Incision: N/A DVT Evaluation: No significant calf/ankle edema. Labs: Lab Results  Component Value Date   WBC 14.7 (H) 09/11/2024   HGB 7.5 (L) 09/11/2024   HCT 26.7 (L) 09/11/2024   MCV 71.6 (L) 09/11/2024   PLT 241 09/11/2024      Latest Ref Rng & Units 01/29/2023    2:38 AM  CMP  Glucose 70 - 99 mg/dL 882   BUN 6 - 20 mg/dL 8   Creatinine 9.55 - 8.99 mg/dL 8.91   Sodium 864 - 854 mmol/L 140   Potassium 3.5 - 5.1 mmol/L 3.6   Chloride 98 - 111 mmol/L 108   CO2 22 - 32 mmol/L 18   Calcium 8.9 - 10.3 mg/dL 9.0   Total Protein 6.5 - 8.1 g/dL 7.7   Total Bilirubin 0.3 - 1.2 mg/dL 0.7   Alkaline Phos 38 - 126 U/L 50   AST 15 - 41 U/L 19   ALT 0 - 44 U/L 13    Edinburgh Score:    09/11/2024    5:55 AM  Edinburgh Postnatal Depression Scale Screening Tool  I have been able to laugh and see  the funny side of things. 1  I have looked forward with enjoyment to things. 2  I have blamed myself unnecessarily when things went wrong. 3  I have been anxious or worried for no good reason. 0  I have felt scared or panicky for no good reason. 0  Things have been getting on top of me. 2  I have been so unhappy that I have had difficulty sleeping. 3  I have felt sad or miserable. 3  I have been so unhappy that I have been crying. 0  The thought of harming myself has occurred to me. 0  Edinburgh Postnatal Depression Scale Total 14   Edinburgh Postnatal Depression Scale Total: (!) 14   After visit meds:  Allergies as of 09/12/2024   No Known Allergies      Medication List     TAKE these medications     acetaminophen  500 MG tablet Commonly known as: TYLENOL  Take 2 tablets (1,000 mg total) by mouth every 6 (six) hours as needed for mild pain (pain score 1-3) or moderate pain (pain score 4-6).   docusate sodium 100 MG capsule Commonly known as: COLACE Take 2 capsules (200 mg total) by mouth daily as needed for mild constipation or moderate constipation.   ferrous sulfate  325 (65 FE) MG EC tablet Take 1 tablet (325 mg total) by mouth every other day.   ibuprofen  600 MG tablet Commonly known as: ADVIL  Take 1 tablet (600 mg total) by mouth every 6 (six) hours.   prenatal multivitamin Tabs tablet Take 1 tablet by mouth daily at 12 noon.         Discharge home in stable condition Infant Feeding: Breast Infant Disposition:home with mother Discharge instruction: per After Visit Summary and Postpartum booklet. Activity: Advance as tolerated. Pelvic rest for 6 weeks.  Diet: iron rich diet Future Appointments: Future Appointments  Date Time Provider Department Center  10/22/2024  2:35 PM Izell Harari, MD [facility]   Follow up Visit:   Please schedule this patient for a In person postpartum visit in 4 weeks with the following provider: Any provider. Additional Postpartum F/U:Mood check in 1 week  Low risk pregnancy complicated by: no prenatal care Delivery mode:  Vaginal, Spontaneous Anticipated Birth Control:  Unsure   09/12/2024 Leeroy KATHEE Pouch, MD

## 2024-09-10 NOTE — Anesthesia Postprocedure Evaluation (Signed)
 Anesthesia Post Note  Patient: Decklyn Reed-Southerland  Procedure(s) Performed: AN AD HOC LABOR EPIDURAL     Patient location during evaluation: Mother Baby Anesthesia Type: Epidural Level of consciousness: awake and alert Pain management: pain level controlled Vital Signs Assessment: post-procedure vital signs reviewed and stable Respiratory status: spontaneous breathing, nonlabored ventilation and respiratory function stable Cardiovascular status: stable Postop Assessment: no headache, no backache and epidural receding Anesthetic complications: no   No notable events documented.  Last Vitals:  Vitals:   09/10/24 1330 09/10/24 1501  BP: 130/62 124/68  Pulse: (!) 105 99  Resp: 18   Temp: 36.7 C 37.1 C  SpO2: 100% 99%    Last Pain:  Vitals:   09/10/24 1834  TempSrc:   PainSc: 3    Pain Goal:                   ECHOSTAR

## 2024-09-10 NOTE — H&P (Addendum)
 OBSTETRIC ADMISSION HISTORY AND PHYSICAL  Sheri Baldwin is 33 y.o. H5E8988 with IUP at Unknown Not found. presenting for contractions. She received no prenatal care. States she believed she was no longer getting her period because she is in her 22's.  ROS (+) FM, ctx 2-3 mins apart (-) VB, LOF. HA, visual changes, CP, SOB, RUQ pain, peripheral edema.   Prenatal History/Complications No PNC OB History  Gravida Para Term Preterm AB Living  4 1 1  1 1   SAB IAB Ectopic Multiple Live Births      1    # Outcome Date GA Lbr Len/2nd Weight Sex Type Anes PTL Lv  4 Current           3 Gravida           2 Term      Vag-Spont     1 AB            Patient Active Problem List   Diagnosis Date Noted   Labor and delivery, indication for care 09/10/2024   No prenatal care in current pregnancy 09/10/2024    Past Medical History: History reviewed. No pertinent past medical history.  Past Surgical History: History reviewed. No pertinent surgical history.  Social History Social History   Socioeconomic History   Marital status: Single    Spouse name: Not on file   Number of children: Not on file   Years of education: Not on file   Highest education level: Not on file  Occupational History   Not on file  Tobacco Use   Smoking status: Never   Smokeless tobacco: Not on file  Substance and Sexual Activity   Alcohol use: No   Drug use: No   Sexual activity: Not on file  Other Topics Concern   Not on file  Social History Narrative   Not on file   Social Drivers of Health   Financial Resource Strain: Not on file  Food Insecurity: No Food Insecurity (09/10/2024)   Hunger Vital Sign    Worried About Running Out of Food in the Last Year: Never true    Ran Out of Food in the Last Year: Never true  Transportation Needs: No Transportation Needs (09/10/2024)   PRAPARE - Administrator, Civil Service (Medical): No    Lack of Transportation (Non-Medical): No   Physical Activity: Not on file  Stress: Not on file  Social Connections: Not on file    Family History: History reviewed. No pertinent family history.  Allergies: No Known Allergies  No medications prior to admission.     Review of Systems  All systems reviewed and negative except as stated in HPI  PHYSICAL EXAM Blood pressure 139/61, pulse (!) 110, temperature 98.4 F (36.9 C), temperature source Axillary, resp. rate 18, height 5' 3 (1.6 m), SpO2 99%, unknown if currently breastfeeding. General appearance: alert and cooperative Lungs: respiration s nonlabored Heart: regular rate114 Abdomen: gravid  Fetal monitoringBaseline: 140 bpm, Variability: Good, Accelerations: none, and Decelerations: Variable/early Uterine activity Frequency: Every 2-3 minutes, Duration: 60-80 seconds, and Intensity: moderate  Dilation: 10 Effacement (%): 100 Station: -3, -2 Exam by:: Lum Sharps RN Presentation: cephalic   Prenatal labs: Pending ABO, Rh: --/--/O POS (11/26 0757) Antibody: NEG (11/26 0757) Rubella:   RPR:    HBsAg:    HIV:     Lab Results  Component Value Date   GBS PRESUMPTIVE NEGATIVE 09/10/2024    Anatomy US : no US  during pregnancy  There is no immunization history on file for this patient.  Prenatal Transfer Tool  Maternal Diabetes: No Genetic Screening: None Maternal Ultrasounds/Referrals: None Fetal Ultrasounds or other Referrals:  None Maternal Substance Abuse:  No Significant Maternal Medications:  None Significant Maternal Lab Results: Other:  Pending Number of Prenatal Visits:Less than or equal to 3 verified prenatal visits Maternal Vaccinations: None Other Comments:  No PNC   Results for orders placed or performed during the hospital encounter of 09/10/24 (from the past 24 hours)  Fern Test   Collection Time: 09/10/24  7:52 AM  Result Value Ref Range   POCT Fern Test Positive = ruptured amniotic membanes   Type and screen [facility]   Collection Time: 09/10/24  7:57 AM  Result Value Ref Range   ABO/RH(D) O POS    Antibody Screen NEG    Sample Expiration      09/13/2024,2359 Performed at [facility], [address], [place], [place] [address]   Group B strep by PCR   Collection Time: 09/10/24  7:58 AM   Specimen: Vaginal/Rectal; Genital  Result Value Ref Range   Group B strep by PCR PRESUMPTIVE NEGATIVE PRESUMPTIVE NEGATIVE  CBC   Collection Time: 09/10/24  7:58 AM  Result Value Ref Range   WBC 11.9 (H) 4.0 - 10.5 K/uL   RBC 4.28 3.87 - 5.11 MIL/uL   Hemoglobin 8.5 (L) 12.0 - 15.0 g/dL   HCT 69.5 (L) 63.9 - 53.9 %   MCV 71.0 (L) 80.0 - 100.0 fL   MCH 19.9 (L) 26.0 - 34.0 pg   MCHC 28.0 (L) 30.0 - 36.0 g/dL   RDW 79.4 (H) 88.4 - 84.4 %   Platelets 267 150 - 400 K/uL   nRBC 0.3 (H) 0.0 - 0.2 %  Lipid panel   Collection Time: 09/10/24  8:40 AM  Result Value Ref Range   Cholesterol 243 (H) 0 - 200 mg/dL   Triglycerides 878 <849 mg/dL   HDL 78 >59 mg/dL   Total CHOL/HDL Ratio 3.1 RATIO   VLDL 24 0 - 40 mg/dL   LDL Cholesterol 858 (H) 0 - 99 mg/dL    Patient Active Problem List   Diagnosis Date Noted   Labor and delivery, indication for care 09/10/2024   No prenatal care in current pregnancy 09/10/2024    ASSESSMENT & PLAN Sheri Baldwin is 33 y.o. [id number] with IUP at Unknown Not found. admitted for contractions. Believed she was no having a period because she is in her 46's.    #Labor: coping well #Pain: Epidural #FWB: Cat 2, moderate variability.  #No PNC: labs ordered. SW postpartum. #GBS status:  Unknown #Feeding: Formula #Reproductive Life planning: undecided #Circ:  Undecided Anemia: 8.5 HGB. Plan for Pitocin  and TXA at delivery.   Conard Me, SNM, RNC-OB Student Nurse Midwife 09/10/2024 10:43 AM   I was present for the exam and agree with above. Fundal ht 41 cm, 7-12 by leopold's. Femur length C/W 37 weeks. Subjectively elevated fluid. Plan  NICU team at delivery due to unknown GA and variables.   Claudene, Adren Dollins , CNM 09/10/2024 10:00 PM

## 2024-09-10 NOTE — Progress Notes (Addendum)
 Labor Progress Note Sheri Baldwin is a 33 y.o. G4P1011 at Unknown presented for contractions.  S:  Working to get comfortable with epidural.  O:  BP 139/61   Pulse (!) 110   Temp 98.4 F (36.9 C) (Axillary)   Resp 18   Ht 5' 3 (1.6 m)   SpO2 99%   BMI 35.29 kg/m   EFM: baseline 135 bpm/ moderate variability/ no accels/ early decels  Toco/IUPC: 1-3 mins apart SVE: Dilation: 10 Dilation Complete Date: 09/10/24 Dilation Complete Time: 1010 Effacement (%): 100 Station: -3, -2 Exam by:: Lum Sharps RN   A/P: 33 y.o. H5E8988 Unknown  1. Labor: coping well. Laboring down. 2. FWB: Cat 2. Moderate variability with early decels. No accelerations noted since admission. 3. Pain: epidural 4. No PNC: labs ordered. SW postpartum. #GBS status:   Unknown #Feeding: Formula #Reproductive Life planning: undecided #Circ:   undecided Anemia: 8.5 HGB. Plan for Pitocin  and TXA at delivery.  Polyhydramnios: Copious fluids since AROM and large amount of fluid noted on ultrasound.   Anticipate SVB.  Peter Daquila VEAR Me, Student-MidWife 10:49 AM

## 2024-09-11 LAB — CBC
HCT: 26.7 % — ABNORMAL LOW (ref 36.0–46.0)
Hemoglobin: 7.5 g/dL — ABNORMAL LOW (ref 12.0–15.0)
MCH: 20.1 pg — ABNORMAL LOW (ref 26.0–34.0)
MCHC: 28.1 g/dL — ABNORMAL LOW (ref 30.0–36.0)
MCV: 71.6 fL — ABNORMAL LOW (ref 80.0–100.0)
Platelets: 241 K/uL (ref 150–400)
RBC: 3.73 MIL/uL — ABNORMAL LOW (ref 3.87–5.11)
RDW: 20.4 % — ABNORMAL HIGH (ref 11.5–15.5)
WBC: 14.7 K/uL — ABNORMAL HIGH (ref 4.0–10.5)
nRBC: 0 % (ref 0.0–0.2)

## 2024-09-11 LAB — RUBELLA SCREEN: Rubella: 3.21 {index} (ref >0.99)

## 2024-09-11 LAB — HEMOGLOBIN A1C
Hgb A1c MFr Bld: 5.9 % — ABNORMAL HIGH (ref 4.8–5.6)
Mean Plasma Glucose: 123 mg/dL

## 2024-09-11 LAB — RUBELLA ANTIBODY, IGM: Rubella IgM: <20 [AU]/ml (ref 0.0–19.9)

## 2024-09-11 NOTE — Progress Notes (Signed)
 POSTPARTUM PROGRESS NOTE  Subjective: 33 y.o. H5E8987 who is PPD1 s/p NSVD  Course complicated by: no prenatal care, anemia  Doing well, hasn't really ambulated much yet. No pain or bleeding issues.  Objective: Blood pressure 122/65, pulse 70, temperature 98.3 F (36.8 C), temperature source Oral, resp. rate 18, height 5' 3 (1.6 m), SpO2 100%, unknown if currently breastfeeding.  Physical Exam:  General: alert, cooperative, and no distress Abdomen: soft, nontender nondistended Uterine Fundus: firm Lochia: appropriate Incision: n/a Lower extremities: No significant calf/ankle edema.  Recent Labs    09/10/24 0758 09/11/24 0520  HGB 8.5* 7.5*  HCT 30.4* 26.7*    Assessment/Plan: Postpartum: meeting milestones - Contraception: undecided - MOF: formula - Rh status: O POS  - Rubella status: immune - Dispo: home tomorrow - Consults: SW for no prenatal care  2. Anemia: acute on chronic, patient advised to ambulate and notify us  of any dizziness/ symptoms - continue iron   LOS: 1 day    Rollo ONEIDA Bring, MD, FACOG Obstetrician & Gynecologist, [facility] for [facility], [facility] 09/11/2024, 12:27 PM
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
## 2024-09-12 ENCOUNTER — Other Ambulatory Visit (HOSPITAL_COMMUNITY): Payer: Self-pay

## 2024-09-12 DIAGNOSIS — D62 Acute posthemorrhagic anemia: Secondary | ICD-10-CM | POA: Diagnosis not present

## 2024-09-12 LAB — HEPATITIS B SURFACE ANTIBODY, QUANTITATIVE: Hep B S AB Quant (Post): 7.2 m[IU]/mL — ABNORMAL LOW

## 2024-09-12 LAB — CULTURE, BETA STREP (GROUP B ONLY)

## 2024-09-12 LAB — HCV AB W REFLEX TO QUANT PCR: HCV Ab: NONREACTIVE

## 2024-09-12 LAB — SURGICAL PATHOLOGY

## 2024-09-12 LAB — HCV INTERPRETATION

## 2024-09-12 LAB — HEPATITIS B SURFACE ANTIGEN: Hepatitis B Surface Ag: NONREACTIVE

## 2024-09-12 MED ORDER — METHYLPREDNISOLONE SODIUM SUCC 125 MG IJ SOLR
125.0000 mg | Freq: Once | INTRAMUSCULAR | Status: DC | PRN
Start: 2024-09-12 — End: 2024-09-12

## 2024-09-12 MED ORDER — ACETAMINOPHEN 500 MG PO TABS
1000.0000 mg | ORAL_TABLET | Freq: Four times a day (QID) | ORAL | 0 refills | Status: AC | PRN
  Filled 2024-09-12: qty 60, 8d supply, fill #0

## 2024-09-12 MED ORDER — FERROUS SULFATE 325 (65 FE) MG PO TABS
325.0000 mg | ORAL_TABLET | ORAL | 0 refills | Status: AC
  Filled 2024-09-12: qty 60, 120d supply, fill #0

## 2024-09-12 MED ORDER — SODIUM CHLORIDE 0.9 % IV BOLUS
500.0000 mL | Freq: Once | INTRAVENOUS | Status: DC | PRN
Start: 2024-09-12 — End: 2024-09-12

## 2024-09-12 MED ORDER — DIPHENHYDRAMINE HCL 50 MG/ML IJ SOLN: 25.0000 mg | Freq: Once | INTRAMUSCULAR | Status: DC | PRN

## 2024-09-12 MED ORDER — IBUPROFEN 600 MG PO TABS
600.0000 mg | ORAL_TABLET | Freq: Four times a day (QID) | ORAL | 0 refills | Status: AC
  Filled 2024-09-12: qty 30, 8d supply, fill #0

## 2024-09-12 MED ORDER — EPINEPHRINE 0.3 MG/0.3ML IJ SOAJ: 0.3000 mg | Freq: Once | INTRAMUSCULAR | Status: DC | PRN

## 2024-09-12 MED ORDER — SODIUM CHLORIDE 0.9 % IV SOLN: INTRAVENOUS | Status: DC | PRN

## 2024-09-12 MED ORDER — PRENATAL MULTIVITAMIN CH
1.0000 | ORAL_TABLET | Freq: Every day | ORAL | 0 refills | Status: AC
  Filled 2024-09-12: qty 90, 90d supply, fill #0

## 2024-09-12 MED ORDER — IRON SUCROSE 500 MG IVPB - SIMPLE MED: 500.0000 mg | Freq: Once | INTRAVENOUS | Status: DC

## 2024-09-12 MED ORDER — ALBUTEROL SULFATE (2.5 MG/3ML) 0.083% IN NEBU: 2.5000 mg | INHALATION_SOLUTION | Freq: Once | RESPIRATORY_TRACT | Status: DC | PRN

## 2024-09-12 MED ORDER — DOCUSATE SODIUM 100 MG PO CAPS
200.0000 mg | ORAL_CAPSULE | Freq: Every day | ORAL | 0 refills | Status: AC | PRN
  Filled 2024-09-12: qty 60, 30d supply, fill #0

## 2024-09-12 MED ORDER — SODIUM CHLORIDE 0.9 % IV SOLN
500.0000 mg | Freq: Once | INTRAVENOUS | Status: AC
  Administered 2024-09-12: 500 mg via INTRAVENOUS
  Filled 2024-09-12: qty 25

## 2024-09-13 LAB — RUBELLA ANTIBODY, IGM: Rubella IgM: <20 [AU]/ml (ref 0.0–19.9)

## 2024-09-13 LAB — HCV INTERPRETATION

## 2024-09-13 LAB — HEPATITIS B SURFACE ANTIBODY, QUANTITATIVE: Hep B S AB Quant (Post): 4.9 m[IU]/mL — ABNORMAL LOW

## 2024-09-13 LAB — RUBELLA SCREEN: Rubella: 3.02 {index} (ref >0.99)

## 2024-09-13 LAB — HCV AB W REFLEX TO QUANT PCR: HCV Ab: NONREACTIVE

## 2024-09-15 ENCOUNTER — Telehealth (HOSPITAL_COMMUNITY): Payer: Self-pay | Admitting: *Deleted

## 2024-09-15 ENCOUNTER — Telehealth: Payer: Self-pay | Admitting: Family Medicine

## 2024-09-15 DIAGNOSIS — Z1331 Encounter for screening for depression: Secondary | ICD-10-CM

## 2024-09-15 NOTE — Telephone Encounter (Signed)
 Called patient to get her scheduled for our behavioral health clinic but she did not answer the call. Could not leave her a message because her mailbox has not been set up.

## 2024-09-15 NOTE — Telephone Encounter (Signed)
 Patient scored 14 on EPDS in the hospital. Answer to question ten was 0-never. Placed order for [facility] referral. Dr. Kandis notified via [facility] order.  Mliss Sieve, RN 09/15/2024 13:51

## 2024-09-17 ENCOUNTER — Ambulatory Visit: Payer: Self-pay

## 2024-09-20 ENCOUNTER — Telehealth (HOSPITAL_COMMUNITY): Payer: Self-pay

## 2024-09-20 NOTE — Telephone Encounter (Signed)
 09/20/2024 1213  Name: Sheri Baldwin MRN: [id number] DOB: September 14, 1991  Reason for Call:  Transition of Care Hospital Discharge Call  Contact Status: Patient Contact Status: Unable to contact (Mailbox is full)  Product manager needed:          Follow-Up Questions:    Van Postnatal Depression Scale:  In the Past 7 Days:    PHQ2-9 Depression Scale:     Discharge Follow-up:    Post-discharge interventions: NA  Signature  Rosaline Deretha PEAK

## 2024-10-03 NOTE — BH Specialist Note (Unsigned)
"     Integrated Behavioral Health via Telemedicine Visit  10/03/2024 Latorie Reed-Southerland [id number]  Number of Integrated Behavioral Health Clinician visits: No data recorded Session Start time: No data recorded  Session End time: No data recorded Total time in minutes: No data recorded   Referring Provider: *** Patient/Family location: *** [facility] Provider location: *** All persons participating in visit: *** Types of Service: {CHL AMB TYPE OF SERVICE:[phone]}  I connected with Yaffa Reed-Southerland and/or Ludwika Reed-Southerland's {family members:20773} via  Telephone or Video Enabled Telemedicine Application  (Video is Caregility application) and verified that I am speaking with the correct person using two identifiers. Discussed confidentiality: {YES/NO:21197}  I discussed the limitations of telemedicine and the availability of in person appointments.  Discussed there is a possibility of technology failure and discussed alternative modes of communication if that failure occurs.  I discussed that engaging in this telemedicine visit, they consent to the provision of behavioral healthcare and the services will be billed under their insurance.  Patient and/or legal guardian expressed understanding and consented to Telemedicine visit: {YES/NO:21197}  Presenting Concerns: Patient and/or family reports the following symptoms/concerns: *** Duration of problem: ***; Severity of problem: {Mild/Moderate/Severe:20260}  Patient and/or Family's Strengths/Protective Factors: {CHL AMB BH PROTECTIVE [phone]}  Goals Addressed: Patient will:  Reduce symptoms of: {IBH Symptoms:21014056}   Increase knowledge and/or ability of: {IBH Patient Tools:21014057}   Demonstrate ability to: {IBH Goals:21014053}  Progress towards Goals: {CHL AMB BH PROGRESS TOWARDS GOALS:[phone]}    Interventions: Interventions utilized:  {IBH Interventions:21014054} Standardized Assessments  completed: {IBH Screening Tools:21014051}    Patient and/or Family Response: ***  Clinical Assessment/Diagnosis  No diagnosis found.    Assessment: Patient currently experiencing ***.   Patient may benefit from ***.  Plan: Follow up with behavioral health clinician on : *** Behavioral recommendations: *** Referral(s): {IBH Referrals:21014055}  I discussed the assessment and treatment plan with the patient and/or parent/guardian. They were provided an opportunity to ask questions and all were answered. They agreed with the plan and demonstrated an understanding of the instructions.   They were advised to call back or seek an in-person evaluation if the symptoms worsen or if the condition fails to improve as anticipated.  Deniqua Perry C Antonela Freiman, LCSW "

## 2024-10-06 ENCOUNTER — Ambulatory Visit: Admitting: Clinical

## 2024-10-06 DIAGNOSIS — Z91199 Patient's noncompliance with other medical treatment and regimen due to unspecified reason: Secondary | ICD-10-CM

## 2024-10-22 ENCOUNTER — Ambulatory Visit: Admitting: Obstetrics and Gynecology
